# Patient Record
Sex: Female | Born: 1945 | Race: White | Hispanic: No | Smoking: Never smoker
Health system: Southern US, Community
[De-identification: ages and names within clinical notes are randomized; demographics above are authoritative.]

## PROBLEM LIST (undated history)

## (undated) DIAGNOSIS — F32A Depression, unspecified: Secondary | ICD-10-CM

## (undated) DIAGNOSIS — M1990 Unspecified osteoarthritis, unspecified site: Secondary | ICD-10-CM

## (undated) DIAGNOSIS — E785 Hyperlipidemia, unspecified: Secondary | ICD-10-CM

## (undated) DIAGNOSIS — K635 Polyp of colon: Secondary | ICD-10-CM

## (undated) DIAGNOSIS — I428 Other cardiomyopathies: Secondary | ICD-10-CM

## (undated) DIAGNOSIS — I10 Essential (primary) hypertension: Secondary | ICD-10-CM

## (undated) DIAGNOSIS — N200 Calculus of kidney: Secondary | ICD-10-CM

## (undated) DIAGNOSIS — F329 Major depressive disorder, single episode, unspecified: Secondary | ICD-10-CM

## (undated) DIAGNOSIS — I509 Heart failure, unspecified: Secondary | ICD-10-CM

## (undated) DIAGNOSIS — E039 Hypothyroidism, unspecified: Secondary | ICD-10-CM

## (undated) DIAGNOSIS — D649 Anemia, unspecified: Secondary | ICD-10-CM

## (undated) DIAGNOSIS — I340 Nonrheumatic mitral (valve) insufficiency: Secondary | ICD-10-CM

## (undated) DIAGNOSIS — M25569 Pain in unspecified knee: Secondary | ICD-10-CM

## (undated) DIAGNOSIS — K219 Gastro-esophageal reflux disease without esophagitis: Secondary | ICD-10-CM

## (undated) DIAGNOSIS — F0390 Unspecified dementia without behavioral disturbance: Secondary | ICD-10-CM

## (undated) HISTORY — PX: CHOLECYSTECTOMY: SHX55

## (undated) HISTORY — DX: Calculus of kidney: N20.0

## (undated) HISTORY — DX: Anemia, unspecified: D64.9

## (undated) HISTORY — DX: Polyp of colon: K63.5

## (undated) HISTORY — DX: Depression, unspecified: F32.A

## (undated) HISTORY — DX: Essential (primary) hypertension: I10

## (undated) HISTORY — DX: Hypothyroidism, unspecified: E03.9

## (undated) HISTORY — DX: Unspecified dementia, unspecified severity, without behavioral disturbance, psychotic disturbance, mood disturbance, and anxiety: F03.90

## (undated) HISTORY — DX: Heart failure, unspecified: I50.9

## (undated) HISTORY — DX: Nonrheumatic mitral (valve) insufficiency: I34.0

## (undated) HISTORY — PX: TONSILLECTOMY: SUR1361

## (undated) HISTORY — DX: Pain in unspecified knee: M25.569

## (undated) HISTORY — PX: TOTAL KNEE ARTHROPLASTY: SHX125

## (undated) HISTORY — DX: Hyperlipidemia, unspecified: E78.5

## (undated) HISTORY — DX: Unspecified osteoarthritis, unspecified site: M19.90

## (undated) HISTORY — DX: Gastro-esophageal reflux disease without esophagitis: K21.9

## (undated) HISTORY — DX: Other cardiomyopathies: I42.8

## (undated) HISTORY — DX: Major depressive disorder, single episode, unspecified: F32.9

---

## 2002-07-07 ENCOUNTER — Other Ambulatory Visit: Admission: RE | Admit: 2002-07-07 | Discharge: 2002-07-07 | Payer: Self-pay | Admitting: Family Medicine

## 2003-09-01 ENCOUNTER — Other Ambulatory Visit: Admission: RE | Admit: 2003-09-01 | Discharge: 2003-09-01 | Payer: Self-pay | Admitting: Family Medicine

## 2004-05-07 ENCOUNTER — Encounter: Admission: RE | Admit: 2004-05-07 | Discharge: 2004-05-07 | Payer: Self-pay | Admitting: Family Medicine

## 2004-10-11 ENCOUNTER — Encounter: Admission: RE | Admit: 2004-10-11 | Discharge: 2004-10-11 | Payer: Self-pay | Admitting: Family Medicine

## 2004-10-26 ENCOUNTER — Ambulatory Visit (HOSPITAL_COMMUNITY): Admission: RE | Admit: 2004-10-26 | Discharge: 2004-10-26 | Payer: Self-pay | Admitting: Gastroenterology

## 2006-04-09 ENCOUNTER — Ambulatory Visit: Payer: Self-pay | Admitting: Internal Medicine

## 2006-05-20 ENCOUNTER — Ambulatory Visit: Payer: Self-pay | Admitting: Internal Medicine

## 2006-06-02 ENCOUNTER — Ambulatory Visit: Payer: Self-pay | Admitting: Internal Medicine

## 2006-07-21 ENCOUNTER — Encounter: Admission: RE | Admit: 2006-07-21 | Discharge: 2006-07-21 | Payer: Self-pay | Admitting: Otolaryngology

## 2006-08-07 ENCOUNTER — Ambulatory Visit: Payer: Self-pay | Admitting: Internal Medicine

## 2006-08-21 ENCOUNTER — Ambulatory Visit: Payer: Self-pay | Admitting: Internal Medicine

## 2006-08-22 ENCOUNTER — Ambulatory Visit (HOSPITAL_COMMUNITY): Admission: RE | Admit: 2006-08-22 | Discharge: 2006-08-22 | Payer: Self-pay | Admitting: Internal Medicine

## 2006-09-01 ENCOUNTER — Ambulatory Visit: Payer: Self-pay | Admitting: Internal Medicine

## 2006-09-23 ENCOUNTER — Ambulatory Visit: Payer: Self-pay | Admitting: Internal Medicine

## 2007-03-12 ENCOUNTER — Ambulatory Visit: Payer: Self-pay | Admitting: Internal Medicine

## 2007-03-12 LAB — CONVERTED CEMR LAB
BUN: 13 mg/dL (ref 6–23)
Basophils Absolute: 0 10*3/uL (ref 0.0–0.1)
Basophils Relative: 0.1 % (ref 0.0–1.0)
CO2: 29 meq/L (ref 19–32)
Calcium: 9.1 mg/dL (ref 8.4–10.5)
Chloride: 109 meq/L (ref 96–112)
Creatinine, Ser: 0.8 mg/dL (ref 0.4–1.2)
Eosinophils Absolute: 0.2 10*3/uL (ref 0.0–0.6)
Eosinophils Relative: 2.5 % (ref 0.0–5.0)
GFR calc Af Amer: 93 mL/min
GFR calc non Af Amer: 77 mL/min
Glucose, Bld: 112 mg/dL — ABNORMAL HIGH (ref 70–99)
HCT: 27.3 % — ABNORMAL LOW (ref 36.0–46.0)
Hemoglobin: 8.8 g/dL — ABNORMAL LOW (ref 12.0–15.0)
Lymphocytes Relative: 13.1 % (ref 12.0–46.0)
MCHC: 32.3 g/dL (ref 30.0–36.0)
MCV: 79.1 fL (ref 78.0–100.0)
Monocytes Absolute: 0.7 10*3/uL (ref 0.2–0.7)
Monocytes Relative: 9.5 % (ref 3.0–11.0)
Neutro Abs: 4.9 10*3/uL (ref 1.4–7.7)
Neutrophils Relative %: 74.8 % (ref 43.0–77.0)
Platelets: 241 10*3/uL (ref 150–400)
Potassium: 3.7 meq/L (ref 3.5–5.1)
Pro B Natriuretic peptide (BNP): 431 pg/mL — ABNORMAL HIGH (ref 0.0–100.0)
RBC: 3.46 M/uL — ABNORMAL LOW (ref 3.87–5.11)
RDW: 15.1 % — ABNORMAL HIGH (ref 11.5–14.6)
Sodium: 145 meq/L (ref 135–145)
TSH: 2.83 microintl units/mL (ref 0.35–5.50)
WBC: 7 10*3/uL (ref 4.5–10.5)

## 2007-03-20 ENCOUNTER — Ambulatory Visit: Payer: Self-pay | Admitting: Internal Medicine

## 2007-03-20 LAB — CONVERTED CEMR LAB
Basophils Absolute: 0 10*3/uL (ref 0.0–0.1)
Basophils Relative: 0.5 % (ref 0.0–1.0)
Eosinophils Absolute: 0.1 10*3/uL (ref 0.0–0.6)
Eosinophils Relative: 0.9 % (ref 0.0–5.0)
HCT: 27.7 % — ABNORMAL LOW (ref 36.0–46.0)
Hemoglobin: 8.8 g/dL — ABNORMAL LOW (ref 12.0–15.0)
Iron: 13 ug/dL — ABNORMAL LOW (ref 42–145)
Lymphocytes Relative: 12.8 % (ref 12.0–46.0)
MCHC: 31.7 g/dL (ref 30.0–36.0)
MCV: 76.9 fL — ABNORMAL LOW (ref 78.0–100.0)
Monocytes Absolute: 0.7 10*3/uL (ref 0.2–0.7)
Monocytes Relative: 7.5 % (ref 3.0–11.0)
Neutro Abs: 7.7 10*3/uL (ref 1.4–7.7)
Neutrophils Relative %: 78.3 % — ABNORMAL HIGH (ref 43.0–77.0)
Platelets: 264 10*3/uL (ref 150–400)
Pro B Natriuretic peptide (BNP): 856 pg/mL — ABNORMAL HIGH (ref 0.0–100.0)
RBC: 3.6 M/uL — ABNORMAL LOW (ref 3.87–5.11)
RDW: 15.6 % — ABNORMAL HIGH (ref 11.5–14.6)
Saturation Ratios: 2.3 % — ABNORMAL LOW (ref 20.0–50.0)
Transferrin: 398.9 mg/dL — ABNORMAL HIGH (ref >=212.0)
WBC: 9.8 10*3/uL (ref 4.5–10.5)

## 2007-03-27 ENCOUNTER — Ambulatory Visit: Payer: Self-pay | Admitting: Pulmonary Disease

## 2007-03-27 LAB — CONVERTED CEMR LAB
Basophils Absolute: 0 10*3/uL (ref 0.0–0.1)
Basophils Relative: 0 % (ref 0.0–1.0)
Eosinophils Absolute: 0.3 10*3/uL (ref 0.0–0.6)
Eosinophils Relative: 4.4 % (ref 0.0–5.0)
Folate: 11.4 ng/mL
HCT: 30.5 % — ABNORMAL LOW (ref 36.0–46.0)
Hemoglobin: 9.5 g/dL — ABNORMAL LOW (ref 12.0–15.0)
Iron: 15 ug/dL — ABNORMAL LOW (ref 42–145)
Lymphocytes Relative: 16.4 % (ref 12.0–46.0)
MCHC: 31.1 g/dL (ref 30.0–36.0)
MCV: 75.9 fL — ABNORMAL LOW (ref 78.0–100.0)
Monocytes Absolute: 0.9 10*3/uL — ABNORMAL HIGH (ref 0.2–0.7)
Monocytes Relative: 14.2 % — ABNORMAL HIGH (ref 3.0–11.0)
Neutro Abs: 4.2 10*3/uL (ref 1.4–7.7)
Neutrophils Relative %: 65 % (ref 43.0–77.0)
Platelets: 276 10*3/uL (ref 150–400)
Pro B Natriuretic peptide (BNP): 223 pg/mL — ABNORMAL HIGH (ref 0.0–100.0)
RBC: 4.01 M/uL (ref 3.87–5.11)
RDW: 16.9 % — ABNORMAL HIGH (ref 11.5–14.6)
Saturation Ratios: 2.4 % — ABNORMAL LOW (ref 20.0–50.0)
Transferrin: 444.4 mg/dL — ABNORMAL HIGH (ref >=212.0)
Vitamin B-12: 1477 pg/mL — ABNORMAL HIGH (ref 211–911)
WBC: 6.4 10*3/uL (ref 4.5–10.5)

## 2007-04-02 ENCOUNTER — Ambulatory Visit (HOSPITAL_COMMUNITY): Admission: RE | Admit: 2007-04-02 | Discharge: 2007-04-02 | Payer: Self-pay | Admitting: Gastroenterology

## 2007-04-25 HISTORY — PX: CARDIAC CATHETERIZATION: SHX172

## 2007-05-01 ENCOUNTER — Ambulatory Visit: Payer: Self-pay | Admitting: Internal Medicine

## 2007-05-01 ENCOUNTER — Inpatient Hospital Stay (HOSPITAL_COMMUNITY): Admission: EM | Admit: 2007-05-01 | Discharge: 2007-05-08 | Payer: Self-pay | Admitting: Internal Medicine

## 2007-05-02 ENCOUNTER — Encounter: Payer: Self-pay | Admitting: Internal Medicine

## 2007-05-03 ENCOUNTER — Ambulatory Visit: Payer: Self-pay | Admitting: Oncology

## 2007-05-03 ENCOUNTER — Ambulatory Visit: Payer: Self-pay | Admitting: Internal Medicine

## 2007-05-06 ENCOUNTER — Ambulatory Visit: Payer: Self-pay | Admitting: Oncology

## 2007-05-06 ENCOUNTER — Encounter: Payer: Self-pay | Admitting: Internal Medicine

## 2007-05-12 ENCOUNTER — Ambulatory Visit: Payer: Self-pay | Admitting: Family Medicine

## 2007-05-12 DIAGNOSIS — Z860100 Personal history of colon polyps, unspecified: Secondary | ICD-10-CM | POA: Insufficient documentation

## 2007-05-12 DIAGNOSIS — J309 Allergic rhinitis, unspecified: Secondary | ICD-10-CM | POA: Insufficient documentation

## 2007-05-12 DIAGNOSIS — I2510 Atherosclerotic heart disease of native coronary artery without angina pectoris: Secondary | ICD-10-CM | POA: Insufficient documentation

## 2007-05-12 DIAGNOSIS — F329 Major depressive disorder, single episode, unspecified: Secondary | ICD-10-CM

## 2007-05-12 DIAGNOSIS — I509 Heart failure, unspecified: Secondary | ICD-10-CM | POA: Insufficient documentation

## 2007-05-12 DIAGNOSIS — E039 Hypothyroidism, unspecified: Secondary | ICD-10-CM | POA: Insufficient documentation

## 2007-05-12 DIAGNOSIS — H409 Unspecified glaucoma: Secondary | ICD-10-CM | POA: Insufficient documentation

## 2007-05-12 DIAGNOSIS — J45909 Unspecified asthma, uncomplicated: Secondary | ICD-10-CM | POA: Insufficient documentation

## 2007-05-12 DIAGNOSIS — M1990 Unspecified osteoarthritis, unspecified site: Secondary | ICD-10-CM | POA: Insufficient documentation

## 2007-05-12 DIAGNOSIS — D509 Iron deficiency anemia, unspecified: Secondary | ICD-10-CM

## 2007-05-12 DIAGNOSIS — I10 Essential (primary) hypertension: Secondary | ICD-10-CM | POA: Insufficient documentation

## 2007-05-12 DIAGNOSIS — R413 Other amnesia: Secondary | ICD-10-CM | POA: Insufficient documentation

## 2007-05-12 DIAGNOSIS — K219 Gastro-esophageal reflux disease without esophagitis: Secondary | ICD-10-CM

## 2007-05-12 DIAGNOSIS — E785 Hyperlipidemia, unspecified: Secondary | ICD-10-CM

## 2007-05-12 DIAGNOSIS — Z87442 Personal history of urinary calculi: Secondary | ICD-10-CM

## 2007-05-15 LAB — CBC WITH DIFFERENTIAL/PLATELET
BASO%: 0.7 % (ref 0.0–2.0)
EOS%: 3.9 % (ref 0.0–7.0)
MCH: 21.9 pg — ABNORMAL LOW (ref 26.0–34.0)
MCHC: 31.1 g/dL — ABNORMAL LOW (ref 32.0–36.0)
NEUT%: 68.4 % (ref 39.6–76.8)
RBC: 4.92 10*6/uL (ref 3.70–5.32)
RDW: 21.3 % — ABNORMAL HIGH (ref 11.3–14.5)
lymph#: 1 10*3/uL (ref 0.9–3.3)

## 2007-05-19 ENCOUNTER — Telehealth: Payer: Self-pay | Admitting: Internal Medicine

## 2007-05-25 ENCOUNTER — Ambulatory Visit: Payer: Self-pay | Admitting: Internal Medicine

## 2007-05-25 LAB — CONVERTED CEMR LAB
BUN: 24 mg/dL — ABNORMAL HIGH (ref 6–23)
CO2: 30 meq/L (ref 19–32)
Calcium: 10 mg/dL (ref 8.4–10.5)
Chloride: 104 meq/L (ref 96–112)
Creatinine, Ser: 1.4 mg/dL — ABNORMAL HIGH (ref 0.4–1.2)
GFR calc Af Amer: 49 mL/min
GFR calc non Af Amer: 40 mL/min
Glucose, Bld: 110 mg/dL — ABNORMAL HIGH (ref 70–99)
Potassium: 3.8 meq/L (ref 3.5–5.1)
Sodium: 141 meq/L (ref 135–145)

## 2007-05-28 LAB — CBC & DIFF AND RETIC
Basophils Absolute: 0 10*3/uL (ref 0.0–0.1)
EOS%: 2.6 % (ref 0.0–7.0)
HGB: 11.1 g/dL — ABNORMAL LOW (ref 11.6–15.9)
IRF: 0.15 (ref 0.130–0.330)
MCH: 22.8 pg — ABNORMAL LOW (ref 26.0–34.0)
NEUT#: 3.7 10*3/uL (ref 1.5–6.5)
RDW: 23.3 % — ABNORMAL HIGH (ref 11.3–14.5)
RETIC #: 70 10*3/uL (ref 19.7–115.1)
lymph#: 0.8 10*3/uL — ABNORMAL LOW (ref 0.9–3.3)

## 2007-05-29 ENCOUNTER — Telehealth: Payer: Self-pay | Admitting: Family Medicine

## 2007-05-29 LAB — COMPREHENSIVE METABOLIC PANEL
ALT: 27 U/L (ref 0–35)
AST: 38 U/L — ABNORMAL HIGH (ref 0–37)
Alkaline Phosphatase: 75 U/L (ref 39–117)
Calcium: 9.7 mg/dL (ref 8.4–10.5)
Chloride: 102 mEq/L (ref 96–112)
Creatinine, Ser: 1.12 mg/dL (ref 0.40–1.20)
Total Bilirubin: 0.8 mg/dL (ref 0.3–1.2)

## 2007-05-29 LAB — HAPTOGLOBIN: Haptoglobin: 106 mg/dL (ref 16–200)

## 2007-06-02 ENCOUNTER — Telehealth: Payer: Self-pay | Admitting: Family Medicine

## 2007-06-03 ENCOUNTER — Ambulatory Visit: Payer: Self-pay | Admitting: Internal Medicine

## 2007-06-03 ENCOUNTER — Ambulatory Visit: Payer: Self-pay

## 2007-06-03 ENCOUNTER — Encounter: Payer: Self-pay | Admitting: Internal Medicine

## 2007-06-03 LAB — CONVERTED CEMR LAB
BUN: 16 mg/dL (ref 6–23)
CO2: 30 meq/L (ref 19–32)
Calcium: 9.2 mg/dL (ref 8.4–10.5)
Chloride: 99 meq/L (ref 96–112)
Creatinine, Ser: 1.5 mg/dL — ABNORMAL HIGH (ref 0.4–1.2)
GFR calc Af Amer: 45 mL/min
GFR calc non Af Amer: 37 mL/min
Glucose, Bld: 99 mg/dL (ref 70–99)
Potassium: 3.9 meq/L (ref 3.5–5.1)
Pro B Natriuretic peptide (BNP): 139 pg/mL — ABNORMAL HIGH (ref 0.0–100.0)
Sodium: 138 meq/L (ref 135–145)

## 2007-06-05 ENCOUNTER — Ambulatory Visit: Payer: Self-pay | Admitting: Family Medicine

## 2007-06-05 DIAGNOSIS — R630 Anorexia: Secondary | ICD-10-CM | POA: Insufficient documentation

## 2007-06-10 ENCOUNTER — Encounter: Admission: RE | Admit: 2007-06-10 | Discharge: 2007-06-10 | Payer: Self-pay | Admitting: Family Medicine

## 2007-06-15 LAB — CBC WITH DIFFERENTIAL/PLATELET
BASO%: 0.1 % (ref 0.0–2.0)
EOS%: 3.5 % (ref 0.0–7.0)
MCH: 23.5 pg — ABNORMAL LOW (ref 26.0–34.0)
MCHC: 32.5 g/dL (ref 32.0–36.0)
RDW: 25.4 % — ABNORMAL HIGH (ref 11.3–14.5)
WBC: 5.4 10*3/uL (ref 3.9–10.0)
lymph#: 1 10*3/uL (ref 0.9–3.3)

## 2007-06-16 ENCOUNTER — Encounter: Payer: Self-pay | Admitting: Family Medicine

## 2007-06-16 ENCOUNTER — Encounter: Payer: Self-pay | Admitting: Internal Medicine

## 2007-07-02 ENCOUNTER — Ambulatory Visit: Payer: Self-pay | Admitting: Internal Medicine

## 2007-07-09 ENCOUNTER — Ambulatory Visit: Payer: Self-pay | Admitting: Oncology

## 2007-07-13 LAB — COMPREHENSIVE METABOLIC PANEL
ALT: 22 U/L (ref 0–35)
AST: 31 U/L (ref 0–37)
Calcium: 9.7 mg/dL (ref 8.4–10.5)
Chloride: 106 mEq/L (ref 96–112)
Creatinine, Ser: 0.81 mg/dL (ref 0.40–1.20)

## 2007-07-13 LAB — CBC & DIFF AND RETIC
Basophils Absolute: 0 10*3/uL (ref 0.0–0.1)
EOS%: 7.4 % — ABNORMAL HIGH (ref 0.0–7.0)
Eosinophils Absolute: 0.4 10*3/uL (ref 0.0–0.5)
HGB: 11.9 g/dL (ref 11.6–15.9)
IRF: 0.23 (ref 0.130–0.330)
MONO#: 0.5 10*3/uL (ref 0.1–0.9)
NEUT#: 4.1 10*3/uL (ref 1.5–6.5)
RDW: 26.3 % — ABNORMAL HIGH (ref 11.3–14.5)
RETIC #: 24.1 10*3/uL (ref 19.7–115.1)
WBC: 5.8 10*3/uL (ref 3.9–10.0)
lymph#: 0.7 10*3/uL — ABNORMAL LOW (ref 0.9–3.3)

## 2007-07-27 ENCOUNTER — Telehealth: Payer: Self-pay | Admitting: Family Medicine

## 2007-08-12 ENCOUNTER — Telehealth: Payer: Self-pay | Admitting: Family Medicine

## 2007-08-14 ENCOUNTER — Ambulatory Visit: Payer: Self-pay | Admitting: Family Medicine

## 2007-08-14 DIAGNOSIS — J209 Acute bronchitis, unspecified: Secondary | ICD-10-CM

## 2007-08-14 LAB — CONVERTED CEMR LAB: Hemoglobin: 11.5 g/dL

## 2007-08-19 ENCOUNTER — Telehealth (INDEPENDENT_AMBULATORY_CARE_PROVIDER_SITE_OTHER): Payer: Self-pay | Admitting: *Deleted

## 2007-08-25 ENCOUNTER — Ambulatory Visit: Payer: Self-pay | Admitting: Oncology

## 2007-08-27 LAB — CBC WITH DIFFERENTIAL/PLATELET
BASO%: 0.4 % (ref 0.0–2.0)
EOS%: 10.1 % — ABNORMAL HIGH (ref 0.0–7.0)
HCT: 34.6 % — ABNORMAL LOW (ref 34.8–46.6)
LYMPH%: 11.6 % — ABNORMAL LOW (ref 14.0–48.0)
MCH: 27.5 pg (ref 26.0–34.0)
MCHC: 33.8 g/dL (ref 32.0–36.0)
MONO%: 7.7 % (ref 0.0–13.0)
NEUT%: 70.2 % (ref 39.6–76.8)
Platelets: 155 10*3/uL (ref 145–400)

## 2007-09-08 ENCOUNTER — Encounter: Admission: RE | Admit: 2007-09-08 | Discharge: 2007-10-20 | Payer: Self-pay | Admitting: Neurology

## 2007-09-18 ENCOUNTER — Ambulatory Visit: Payer: Self-pay | Admitting: Family Medicine

## 2007-09-18 DIAGNOSIS — L659 Nonscarring hair loss, unspecified: Secondary | ICD-10-CM | POA: Insufficient documentation

## 2007-10-01 ENCOUNTER — Telehealth (INDEPENDENT_AMBULATORY_CARE_PROVIDER_SITE_OTHER): Payer: Self-pay | Admitting: *Deleted

## 2007-10-01 ENCOUNTER — Ambulatory Visit: Payer: Self-pay | Admitting: Internal Medicine

## 2007-10-07 ENCOUNTER — Emergency Department (HOSPITAL_COMMUNITY): Admission: EM | Admit: 2007-10-07 | Discharge: 2007-10-07 | Payer: Self-pay | Admitting: Emergency Medicine

## 2007-10-07 ENCOUNTER — Ambulatory Visit: Payer: Self-pay | Admitting: Oncology

## 2007-10-09 ENCOUNTER — Ambulatory Visit: Payer: Self-pay | Admitting: Family Medicine

## 2007-10-12 LAB — CBC WITH DIFFERENTIAL/PLATELET
BASO%: 0.8 % (ref 0.0–2.0)
Eosinophils Absolute: 0.9 10*3/uL — ABNORMAL HIGH (ref 0.0–0.5)
HCT: 35.9 % (ref 34.8–46.6)
LYMPH%: 18.6 % (ref 14.0–48.0)
MCHC: 34 g/dL (ref 32.0–36.0)
MCV: 83.7 fL (ref 81.0–101.0)
MONO#: 0.6 10*3/uL (ref 0.1–0.9)
MONO%: 9.3 % (ref 0.0–13.0)
NEUT%: 57.6 % (ref 39.6–76.8)
Platelets: 160 10*3/uL (ref 145–400)
WBC: 6.8 10*3/uL (ref 3.9–10.0)

## 2007-10-15 ENCOUNTER — Telehealth: Payer: Self-pay | Admitting: Family Medicine

## 2007-10-16 ENCOUNTER — Ambulatory Visit: Payer: Self-pay | Admitting: Emergency Medicine

## 2007-10-23 ENCOUNTER — Telehealth: Payer: Self-pay | Admitting: Family Medicine

## 2007-10-30 ENCOUNTER — Telehealth: Payer: Self-pay | Admitting: Family Medicine

## 2007-11-03 ENCOUNTER — Ambulatory Visit: Payer: Self-pay | Admitting: Family Medicine

## 2007-11-13 ENCOUNTER — Ambulatory Visit: Payer: Self-pay | Admitting: Emergency Medicine

## 2007-11-18 ENCOUNTER — Telehealth: Payer: Self-pay | Admitting: Family Medicine

## 2007-11-24 ENCOUNTER — Ambulatory Visit: Payer: Self-pay

## 2007-11-24 ENCOUNTER — Encounter: Payer: Self-pay | Admitting: Internal Medicine

## 2007-11-30 ENCOUNTER — Ambulatory Visit: Payer: Self-pay | Admitting: Licensed Clinical Social Worker

## 2007-11-30 ENCOUNTER — Ambulatory Visit: Payer: Self-pay | Admitting: Oncology

## 2007-11-30 LAB — CBC WITH DIFFERENTIAL/PLATELET
Basophils Absolute: 0.1 10*3/uL (ref 0.0–0.1)
EOS%: 13.5 % — ABNORMAL HIGH (ref 0.0–7.0)
Eosinophils Absolute: 0.8 10*3/uL — ABNORMAL HIGH (ref 0.0–0.5)
HCT: 38 % (ref 34.8–46.6)
HGB: 12.7 g/dL (ref 11.6–15.9)
LYMPH%: 17.6 % (ref 14.0–48.0)
MCH: 28.4 pg (ref 26.0–34.0)
MCV: 85.1 fL (ref 81.0–101.0)
MONO%: 7.2 % (ref 0.0–13.0)
NEUT#: 3.5 10*3/uL (ref 1.5–6.5)
NEUT%: 60.8 % (ref 39.6–76.8)
Platelets: 192 10*3/uL (ref 145–400)
RDW: 15.1 % — ABNORMAL HIGH (ref 11.3–14.5)

## 2007-12-14 ENCOUNTER — Ambulatory Visit: Payer: Self-pay | Admitting: Internal Medicine

## 2007-12-17 ENCOUNTER — Ambulatory Visit: Payer: Self-pay | Admitting: Thoracic Surgery (Cardiothoracic Vascular Surgery)

## 2007-12-24 ENCOUNTER — Telehealth: Payer: Self-pay | Admitting: Family Medicine

## 2007-12-28 ENCOUNTER — Ambulatory Visit: Payer: Self-pay | Admitting: Dentistry

## 2007-12-28 ENCOUNTER — Encounter: Admission: RE | Admit: 2007-12-28 | Discharge: 2007-12-28 | Payer: Self-pay | Admitting: Dentistry

## 2008-01-01 ENCOUNTER — Telehealth: Payer: Self-pay | Admitting: Family Medicine

## 2008-01-04 ENCOUNTER — Ambulatory Visit: Payer: Self-pay | Admitting: Dentistry

## 2008-01-06 ENCOUNTER — Ambulatory Visit: Payer: Self-pay | Admitting: Oncology

## 2008-01-11 LAB — CBC WITH DIFFERENTIAL/PLATELET
Basophils Absolute: 0 10*3/uL (ref 0.0–0.1)
EOS%: 7.7 % — ABNORMAL HIGH (ref 0.0–7.0)
Eosinophils Absolute: 0.4 10*3/uL (ref 0.0–0.5)
HCT: 37 % (ref 34.8–46.6)
HGB: 12.7 g/dL (ref 11.6–15.9)
MCH: 28.8 pg (ref 26.0–34.0)
MONO#: 0.4 10*3/uL (ref 0.1–0.9)
NEUT#: 3.5 10*3/uL (ref 1.5–6.5)
NEUT%: 67.9 % (ref 39.6–76.8)
RDW: 13.3 % (ref 11.3–14.5)
lymph#: 0.8 10*3/uL — ABNORMAL LOW (ref 0.9–3.3)

## 2008-01-14 ENCOUNTER — Ambulatory Visit: Payer: Self-pay | Admitting: Internal Medicine

## 2008-02-15 ENCOUNTER — Ambulatory Visit: Payer: Self-pay | Admitting: Thoracic Surgery (Cardiothoracic Vascular Surgery)

## 2008-02-23 ENCOUNTER — Ambulatory Visit: Payer: Self-pay | Admitting: Internal Medicine

## 2008-02-24 ENCOUNTER — Telehealth: Payer: Self-pay | Admitting: Family Medicine

## 2008-03-08 ENCOUNTER — Ambulatory Visit: Payer: Self-pay | Admitting: Dentistry

## 2008-04-08 ENCOUNTER — Ambulatory Visit: Payer: Self-pay | Admitting: Oncology

## 2008-04-12 LAB — CBC WITH DIFFERENTIAL/PLATELET
BASO%: 0.5 % (ref 0.0–2.0)
EOS%: 7.8 % — ABNORMAL HIGH (ref 0.0–7.0)
Eosinophils Absolute: 0.4 10*3/uL (ref 0.0–0.5)
MCH: 30.2 pg (ref 26.0–34.0)
MCV: 88.3 fL (ref 81.0–101.0)
NEUT#: 3 10*3/uL (ref 1.5–6.5)
Platelets: 168 10*3/uL (ref 145–400)
RBC: 4.14 10*6/uL (ref 3.70–5.32)

## 2008-04-15 ENCOUNTER — Ambulatory Visit: Payer: Self-pay | Admitting: Family Medicine

## 2008-04-15 DIAGNOSIS — M25569 Pain in unspecified knee: Secondary | ICD-10-CM

## 2008-04-21 ENCOUNTER — Ambulatory Visit: Payer: Self-pay | Admitting: Internal Medicine

## 2008-05-10 ENCOUNTER — Ambulatory Visit: Payer: Self-pay | Admitting: Family Medicine

## 2008-06-01 ENCOUNTER — Encounter: Admission: RE | Admit: 2008-06-01 | Discharge: 2008-06-01 | Payer: Self-pay | Admitting: Family Medicine

## 2008-06-08 ENCOUNTER — Telehealth: Payer: Self-pay | Admitting: Family Medicine

## 2008-06-30 ENCOUNTER — Ambulatory Visit: Payer: Self-pay | Admitting: Internal Medicine

## 2008-07-07 ENCOUNTER — Encounter: Payer: Self-pay | Admitting: Internal Medicine

## 2008-07-07 ENCOUNTER — Ambulatory Visit: Payer: Self-pay

## 2008-07-15 ENCOUNTER — Encounter: Payer: Self-pay | Admitting: Family Medicine

## 2008-07-18 ENCOUNTER — Encounter: Payer: Self-pay | Admitting: Family Medicine

## 2008-08-12 ENCOUNTER — Ambulatory Visit: Payer: Self-pay | Admitting: Oncology

## 2008-08-16 ENCOUNTER — Encounter: Payer: Self-pay | Admitting: Family Medicine

## 2008-08-16 LAB — CBC WITH DIFFERENTIAL/PLATELET
Basophils Absolute: 0 10*3/uL (ref 0.0–0.1)
Eosinophils Absolute: 0.5 10*3/uL (ref 0.0–0.5)
HCT: 36.9 % (ref 34.8–46.6)
HGB: 12.5 g/dL (ref 11.6–15.9)
LYMPH%: 22.7 % (ref 14.0–49.7)
MCV: 89.7 fL (ref 79.5–101.0)
MONO%: 10.7 % (ref 0.0–14.0)
NEUT#: 2.8 10*3/uL (ref 1.5–6.5)
NEUT%: 55.7 % (ref 38.4–76.8)
Platelets: 158 10*3/uL (ref 145–400)
RDW: 14.3 % (ref 11.2–14.5)

## 2008-08-24 IMAGING — CR DG CHEST 1V PORT
1 series · 1 of 1 positions shown · non-contrast
Comparison: 05/01/07.

CLINICAL DATA: Congestive heart failure. 
 PORTABLE CHEST ? 1 VIEW:

[view not recorded]
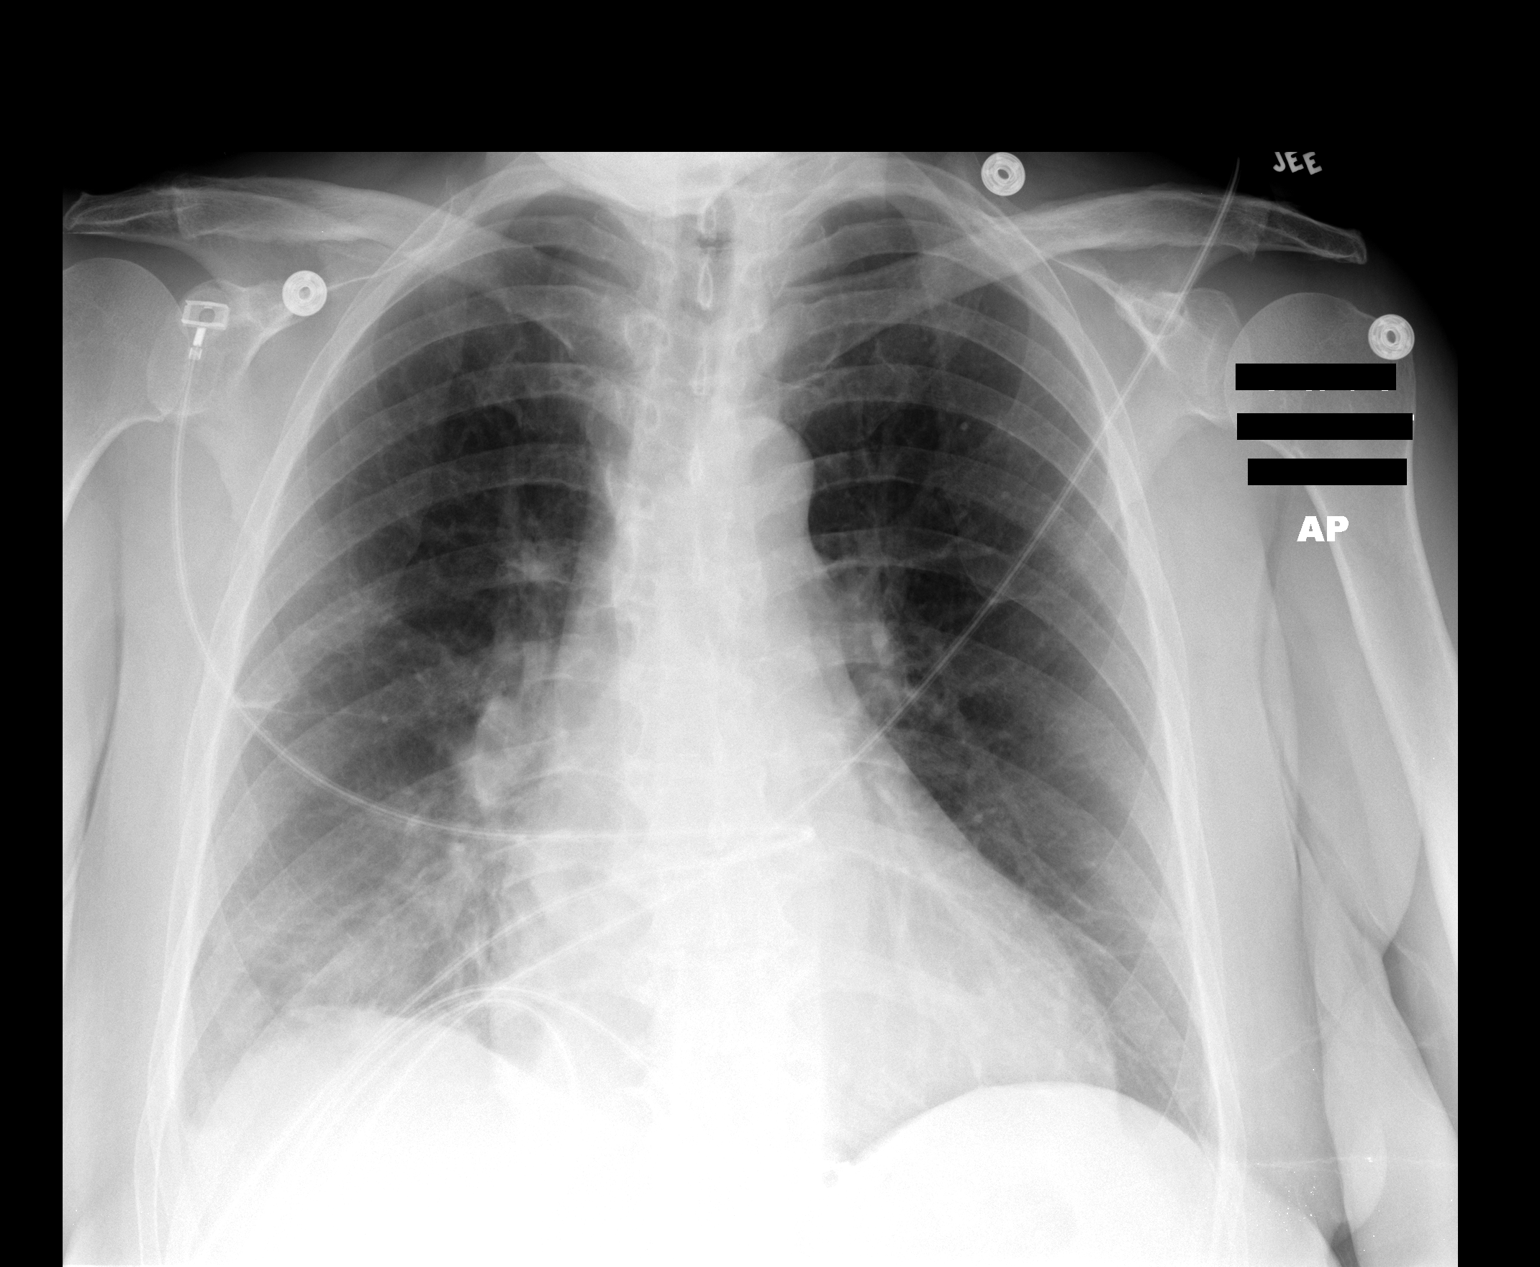

[1 of 1 positions shown; findings below may reference images not displayed]

FINDINGS: Cardiac enlargement is stable.  There is a small right pleural effusion, similar to prior exam. 
 There is new opacity at the right base, which may be due to atelectasis and/or infection.  The remaining portions of the lung are clear.
IMPRESSION: 1.  Stable cardiac enlargement and right pleural effusion. 
 2.  Decreased aeration to right base, which may be due to infection or atelectasis.

## 2008-09-09 ENCOUNTER — Telehealth (INDEPENDENT_AMBULATORY_CARE_PROVIDER_SITE_OTHER): Payer: Self-pay | Admitting: *Deleted

## 2008-09-22 DIAGNOSIS — I080 Rheumatic disorders of both mitral and aortic valves: Secondary | ICD-10-CM | POA: Insufficient documentation

## 2008-09-26 ENCOUNTER — Ambulatory Visit: Payer: Self-pay | Admitting: Emergency Medicine

## 2008-09-27 ENCOUNTER — Encounter: Payer: Self-pay | Admitting: Internal Medicine

## 2008-09-27 ENCOUNTER — Ambulatory Visit: Payer: Self-pay | Admitting: Internal Medicine

## 2008-10-03 ENCOUNTER — Telehealth: Payer: Self-pay | Admitting: Family Medicine

## 2008-10-26 ENCOUNTER — Ambulatory Visit: Payer: Self-pay | Admitting: Emergency Medicine

## 2008-10-27 ENCOUNTER — Telehealth: Payer: Self-pay | Admitting: Emergency Medicine

## 2008-10-28 ENCOUNTER — Encounter: Payer: Self-pay | Admitting: Family Medicine

## 2008-11-08 ENCOUNTER — Telehealth: Payer: Self-pay | Admitting: Family Medicine

## 2008-11-09 ENCOUNTER — Encounter: Payer: Self-pay | Admitting: Family Medicine

## 2008-12-15 ENCOUNTER — Ambulatory Visit: Payer: Self-pay | Admitting: Oncology

## 2008-12-16 ENCOUNTER — Encounter: Payer: Self-pay | Admitting: Internal Medicine

## 2008-12-19 ENCOUNTER — Telehealth: Payer: Self-pay | Admitting: Internal Medicine

## 2008-12-19 LAB — CBC WITH DIFFERENTIAL/PLATELET
BASO%: 0.2 % (ref 0.0–2.0)
LYMPH%: 12.4 % — ABNORMAL LOW (ref 14.0–49.7)
MCHC: 33.8 g/dL (ref 31.5–36.0)
MONO#: 0.5 10*3/uL (ref 0.1–0.9)
RBC: 3.45 10*6/uL — ABNORMAL LOW (ref 3.70–5.45)
WBC: 6.3 10*3/uL (ref 3.9–10.3)
lymph#: 0.8 10*3/uL — ABNORMAL LOW (ref 0.9–3.3)

## 2009-01-16 ENCOUNTER — Ambulatory Visit: Payer: Self-pay | Admitting: Oncology

## 2009-01-18 LAB — FERRITIN: Ferritin: 108 ng/mL (ref 10–291)

## 2009-01-18 LAB — CBC WITH DIFFERENTIAL/PLATELET
EOS%: 8.6 % — ABNORMAL HIGH (ref 0.0–7.0)
MCH: 31.1 pg (ref 25.1–34.0)
MCV: 90.8 fL (ref 79.5–101.0)
MONO%: 11.9 % (ref 0.0–14.0)
RBC: 3.69 10*6/uL — ABNORMAL LOW (ref 3.70–5.45)
RDW: 13.8 % (ref 11.2–14.5)

## 2009-01-30 ENCOUNTER — Encounter: Payer: Self-pay | Admitting: Internal Medicine

## 2009-01-30 ENCOUNTER — Ambulatory Visit: Payer: Self-pay | Admitting: Internal Medicine

## 2009-01-30 DIAGNOSIS — R6889 Other general symptoms and signs: Secondary | ICD-10-CM

## 2009-03-03 ENCOUNTER — Ambulatory Visit: Payer: Self-pay | Admitting: Family Medicine

## 2009-03-03 DIAGNOSIS — K2980 Duodenitis without bleeding: Secondary | ICD-10-CM | POA: Insufficient documentation

## 2009-03-03 DIAGNOSIS — F068 Other specified mental disorders due to known physiological condition: Secondary | ICD-10-CM | POA: Insufficient documentation

## 2009-03-03 LAB — CONVERTED CEMR LAB
Bilirubin Urine: NEGATIVE
Blood in Urine, dipstick: NEGATIVE
Glucose, Urine, Semiquant: NEGATIVE
Nitrite: NEGATIVE
Protein, U semiquant: NEGATIVE
Specific Gravity, Urine: <1.005
Urobilinogen, UA: 0.2
pH: 6.5

## 2009-03-10 ENCOUNTER — Ambulatory Visit: Payer: Self-pay | Admitting: Family Medicine

## 2009-03-10 DIAGNOSIS — G4700 Insomnia, unspecified: Secondary | ICD-10-CM

## 2009-03-23 ENCOUNTER — Telehealth (INDEPENDENT_AMBULATORY_CARE_PROVIDER_SITE_OTHER): Payer: Self-pay | Admitting: *Deleted

## 2009-04-11 ENCOUNTER — Ambulatory Visit: Payer: Self-pay | Admitting: Oncology

## 2009-04-13 ENCOUNTER — Encounter (INDEPENDENT_AMBULATORY_CARE_PROVIDER_SITE_OTHER): Payer: Self-pay | Admitting: *Deleted

## 2009-04-13 LAB — CBC WITH DIFFERENTIAL/PLATELET
Basophils Absolute: 0 10*3/uL (ref 0.0–0.1)
EOS%: 8.8 % — ABNORMAL HIGH (ref 0.0–7.0)
HGB: 11.8 g/dL (ref 11.6–15.9)
MCH: 30.6 pg (ref 25.1–34.0)
MCV: 89.9 fL (ref 79.5–101.0)
MONO%: 9.9 % (ref 0.0–14.0)
NEUT%: 59.4 % (ref 38.4–76.8)
RDW: 13.8 % (ref 11.2–14.5)

## 2009-05-02 ENCOUNTER — Telehealth (INDEPENDENT_AMBULATORY_CARE_PROVIDER_SITE_OTHER): Payer: Self-pay | Admitting: *Deleted

## 2009-05-04 ENCOUNTER — Encounter: Payer: Self-pay | Admitting: Internal Medicine

## 2009-05-25 ENCOUNTER — Encounter (INDEPENDENT_AMBULATORY_CARE_PROVIDER_SITE_OTHER): Payer: Self-pay | Admitting: *Deleted

## 2009-08-02 ENCOUNTER — Ambulatory Visit: Payer: Self-pay | Admitting: Internal Medicine

## 2009-08-07 ENCOUNTER — Inpatient Hospital Stay (HOSPITAL_COMMUNITY): Admission: EM | Admit: 2009-08-07 | Discharge: 2009-08-16 | Payer: Self-pay | Admitting: Emergency Medicine

## 2009-08-07 ENCOUNTER — Ambulatory Visit: Payer: Self-pay | Admitting: Cardiology

## 2009-08-08 ENCOUNTER — Encounter: Payer: Self-pay | Admitting: Family Medicine

## 2009-08-08 ENCOUNTER — Encounter (INDEPENDENT_AMBULATORY_CARE_PROVIDER_SITE_OTHER): Payer: Self-pay | Admitting: Internal Medicine

## 2009-08-24 ENCOUNTER — Ambulatory Visit: Payer: Self-pay | Admitting: Family Medicine

## 2009-09-04 ENCOUNTER — Ambulatory Visit: Payer: Self-pay | Admitting: Family Medicine

## 2009-09-04 DIAGNOSIS — G20 Parkinson's disease: Secondary | ICD-10-CM

## 2009-09-07 ENCOUNTER — Telehealth: Payer: Self-pay | Admitting: Family Medicine

## 2009-09-11 ENCOUNTER — Telehealth: Payer: Self-pay | Admitting: Family Medicine

## 2009-09-13 ENCOUNTER — Ambulatory Visit: Payer: Self-pay | Admitting: Emergency Medicine

## 2009-10-02 ENCOUNTER — Telehealth (INDEPENDENT_AMBULATORY_CARE_PROVIDER_SITE_OTHER): Payer: Self-pay | Admitting: *Deleted

## 2009-10-03 ENCOUNTER — Ambulatory Visit: Payer: Self-pay | Admitting: Family Medicine

## 2009-10-03 DIAGNOSIS — J019 Acute sinusitis, unspecified: Secondary | ICD-10-CM | POA: Insufficient documentation

## 2009-10-10 ENCOUNTER — Telehealth: Payer: Self-pay | Admitting: Family Medicine

## 2009-10-19 ENCOUNTER — Encounter: Payer: Self-pay | Admitting: Family Medicine

## 2009-12-08 ENCOUNTER — Ambulatory Visit: Payer: Self-pay | Admitting: Family Medicine

## 2010-01-01 ENCOUNTER — Ambulatory Visit: Payer: Self-pay | Admitting: Family Medicine

## 2010-01-01 DIAGNOSIS — M533 Sacrococcygeal disorders, not elsewhere classified: Secondary | ICD-10-CM | POA: Insufficient documentation

## 2010-01-01 DIAGNOSIS — B356 Tinea cruris: Secondary | ICD-10-CM | POA: Insufficient documentation

## 2010-03-07 ENCOUNTER — Ambulatory Visit: Payer: Self-pay | Admitting: Family Medicine

## 2010-03-19 ENCOUNTER — Ambulatory Visit: Payer: Self-pay | Admitting: Emergency Medicine

## 2010-03-27 ENCOUNTER — Ambulatory Visit: Payer: Self-pay | Admitting: Internal Medicine

## 2010-03-27 DIAGNOSIS — R0602 Shortness of breath: Secondary | ICD-10-CM | POA: Insufficient documentation

## 2010-04-20 ENCOUNTER — Encounter: Payer: Self-pay | Admitting: Family Medicine

## 2010-06-04 ENCOUNTER — Ambulatory Visit: Payer: Self-pay | Admitting: Emergency Medicine

## 2010-07-16 ENCOUNTER — Encounter: Payer: Self-pay | Admitting: Family Medicine

## 2010-07-17 ENCOUNTER — Telehealth: Payer: Self-pay | Admitting: Family Medicine

## 2010-07-19 ENCOUNTER — Ambulatory Visit
Admission: RE | Admit: 2010-07-19 | Discharge: 2010-07-19 | Payer: Self-pay | Source: Home / Self Care | Attending: Internal Medicine | Admitting: Internal Medicine

## 2010-07-22 LAB — CONVERTED CEMR LAB
Free T4: 0.7 ng/dL (ref 0.6–1.6)
TSH: 1.85 microintl units/mL (ref 0.35–5.50)

## 2010-07-24 NOTE — Assessment & Plan Note (Signed)
Summary: VCD, Parkinson's   Visit Type:  Follow-up Copy to:  [facility] Primary Provider/Referring Provider:  Nelwyn [place] MD  CC:  10 month follow up.  Having increased SOB "all the time" x2wks.  states she does wheezing "quite often." denies cough.  Requesting rxs for proair and symbicort.Marland Kitchen  History of Present Illness: Ms. Lorraine [name] is a very pleasant 65 year old with a history of gastroesophageal reflux disease, asthma, hyperlipidemia,attention deficit disorder and iron-deficiency anemia thought secondary to AVMs.  She was admitted to the hospital at the end of 08 with acute heart failure symptoms.  Echo showed an EF of about 30- 40%.    ROV 10/26/08 -- follows up today after 1 month. Last visit we restarted Symbicort, also restarted Nasacort (no loratadine). Has had significant improvement in her breathing, able to exert more easily and is walking with her friends and sister, wheezing has decreased significantly. Still has hoarseness and upper airway noise. Remains on protonix.   ROV 09/13/09 -- Returns for follow up. Has been unsteady on her feet, fell in 2/11 and was admitted to [facility]. Has been dx with Parkinson's with dementia. She is having more trouble with nasal gtt, UA irritation. Ran out of Symbicort 3 mo ago and has not restarted. Hears raspy voice but not dyspneic.   Current Medications (verified): 1)  Proair Hfa 108 (90 Base) Mcg/act Aers (Albuterol Sulfate) .... Inhale 2 Puff As Directed Every Four Hours 2)  Aspir-Low 81 Mg Tbec (Aspirin) .Marland Kitchen.. 1 Once Daily Pc 3)  Coreg 6.25 Mg  Tabs (Carvedilol) .Marland Kitchen.. 1 By Mouth Two Times A Day 4)  Calcium /d   Tabs (Calcium Carbonate-Vitamin D) .Marland Kitchen.. 1 By Mouth Two Times A Day 5)  Alprazolam 1 Mg  Tabs (Alprazolam) .... 1/2  By Mouth Four Times A Day As Needed For Anxiety 6)  Lamotrigine 200 Mg Tabs (Lamotrigine) .... Take One Tablet By Mouth Once Daily in The Am 7)  Symbicort 80-4.5 Mcg/act Aero (Budesonide-Formoterol Fumarate) .... 2 Puffs Two Times  A Day 8)  Omeprazole 40 Mg Cpdr (Omeprazole) .... Once Daily 9)  Seroquel Xr 150 Mg Xr24h-Tab (Quetiapine Fumarate) .Marland Kitchen.. 1 At Bedtime 10)  Diovan 40 Mg Tabs (Valsartan) .... Two Times A Day 11)  Carbidopa-Levodopa 10-100 Mg Tabs (Carbidopa-Levodopa) .Marland Kitchen.. 1 Three Times A Day  Allergies (verified): 1)  ! * Aricept 2)  ! * Tramadol  Vital Signs:  Patient profile:   65 year old female Height:      65 inches Weight:      150 pounds BMI:     25.05 O2 Sat:      99 % on Room air Temp:     97.2 degrees F oral Pulse rate:   76 / minute BP sitting:   140 / 80  (right arm) Cuff size:   regular  Vitals Entered By: Gweneth Dimitri RN (September 13, 2009 4:37 PM)  O2 Flow:  Room air CC: 10 month follow up.  Having increased SOB "all the time" x2wks.  states she does wheezing "quite often." denies cough.  Requesting rxs for proair and symbicort. Comments Medications reviewed with patient Daytime contact number verified with patient. Gweneth Dimitri RN  September 13, 2009 4:36 PM     Impression & Recommendations:  Problem # 1:  ALLERGIC RHINITIS (ICD-477.9)  add loratadine + veramyst  Orders: Est. Patient Level IV ([address])  Problem # 2:  ASTHMA (ICD-493.90) With VCD. Stop symbicort, use as needed SABA  Medications Added to Medication List  This Visit: 1)  Proventil Hfa 108 (90 Base) Mcg/act Aers (Albuterol sulfate) .... 2 puffs q4h as needed for sob 2)  Diovan 40 Mg Tabs (Valsartan) .... Two times a day 3)  Loratadine 10 Mg Tabs (Loratadine) .Marland Kitchen.. 1 by mouth once daily  Patient Instructions: 1)  Start taking loratadine 10mg  by mouth once daily 2)  Start Veramyst 2 sprays each nostril once daily. If this medication is beneficial then call our office and we will send a script to your pharmacy. 3)  Use Proventil 2 puffs up to every 4 hours if needed for shortness of breath.  4)  Follow up with [name] in 6 months or as needed.  Prescriptions: PROVENTIL HFA 108 (90 BASE) MCG/ACT AERS (ALBUTEROL  SULFATE) 2 puffs q4h as needed for SOB  #1 x 5   Entered and Authorized by:   Leslye Peer MD   Signed by:   Leslye Peer MD on 09/13/2009   Method used:   Electronically to        [facility]  [address] [id number]* (retail)       [address]       [place], [place]  [address]       Ph: [phone] or [phone]       Fax: [phone]   RxID:   [id number] LORATADINE 10 MG TABS (LORATADINE) 1 by mouth once daily  #30 x 11   Entered and Authorized by:   Leslye Peer MD   Signed by:   Leslye Peer MD on 09/13/2009   Method used:   Electronically to        [facility]  [address] [id number]* (retail)       [address]       [place], [place]  [address]       Ph: [phone] or [phone]       Fax: [phone]   RxID:   [id number]    Immunization History:  Influenza Immunization History:    Influenza:  historical (05/24/2009)

## 2010-07-24 NOTE — Assessment & Plan Note (Signed)
Summary: 3 month rov/njr   Vital Signs:  Patient profile:   65 year old female Weight:      145 pounds O2 Sat:      96 % Temp:     98 degrees F Pulse rate:   77 / minute BP sitting:   110 / 70  (left arm)  Vitals Entered By: Pura Spice, RN (March 07, 2010 1:14 PM) CC: 3 month folow up -- questions to ask   History of Present Illness: Here with her sister for routine follow up. She is doing well and has no concerns today. Dr. Nolen Mu doubled up on her dose of Nuedexta last month, and this has helped. Her sacral pain has gone away. She will see her Neurologist   Allergies: 1)  ! * Aricept 2)  ! * Tramadol  Review of Systems       Flu Vaccine Consent Questions     Do you have a history of severe allergic reactions to this vaccine? no    Any prior history of allergic reactions to egg and/or gelatin? no    Do you have a sensitivity to the preservative Thimersol? no    Do you have a past history of Guillan-Barre Syndrome? no    Do you currently have an acute febrile illness? no    Have you ever had a severe reaction to latex? no    Vaccine information given and explained to patient? yes    Are you currently pregnant? no    Lot Number:[id number]   Exp Date:12/22/2010   Site Given  Left Deltoid IM Pura Spice, RN  March 07, 2010 1:53 PM    Complete Medication List: 1)  Proventil Hfa 108 (90 Base) Mcg/act Aers (Albuterol sulfate) .... 2 puffs q4h as needed for sob 2)  Aspir-low 81 Mg Tbec (Aspirin) .Marland Kitchen.. 1 once daily pc 3)  Coreg 6.25 Mg Tabs (Carvedilol) .Marland Kitchen.. 1 by mouth two times a day 4)  Calcium /d Tabs (calcium Carbonate-vitamin D)  .Marland Kitchen.. 1 by mouth two times a day 5)  Alprazolam 1 Mg Tabs (Alprazolam) .... 1/2  by mouth four times a day as needed for anxiety 6)  Lamotrigine 200 Mg Tabs (Lamotrigine) .... Take one tablet by mouth once daily in the am 7)  Omeprazole 40 Mg Cpdr (Omeprazole) .... Once daily 8)  Seroquel Xr 150 Mg Xr24h-tab (Quetiapine fumarate)  .Marland Kitchen.. 1 at bedtime 9)  Carbidopa-levodopa 10-100 Mg Tabs (Carbidopa-levodopa) .Marland Kitchen.. 1 three times a day 10)  Loratadine 10 Mg Tabs (Loratadine) .Marland Kitchen.. 1 by mouth once daily 11)  Veramyst 27.5 Mcg/spray Susp (Fluticasone furoate) .... 2 sprays each nostril once daily 12)  Diovan 160 Mg Tabs (Valsartan) .Marland Kitchen.. 1 two times a day 13)  Hydrochlorothiazide 25 Mg Tabs (Hydrochlorothiazide) .Marland Kitchen.. 1 once daily 14)  Nuedexta 20-10 Mg Caps (Dextromethorphan-quinidine) .Marland Kitchen.. 1 once daily 15)  Ketoconazole 2 % Crea (Ketoconazole) .... Apply three times a day as needed for rash 16)  Aleve 220 Mg Tabs (Naproxen sodium) .... 2 tabs two times a day as needed pain  Other Orders: Flu Vaccine 54yrs + MEDICARE PATIENTS (G9211) Administration Flu vaccine - MCR (G0008)

## 2010-07-24 NOTE — Letter (Signed)
Summary: [facility]  [facility]   Imported By: Maryln Gottron 04/27/2010 14:16:44  _____________________________________________________________________  External Attachment:    Type:   Image     Comment:   External Document

## 2010-07-24 NOTE — Assessment & Plan Note (Signed)
Summary: CONGESTION/CB   Vital Signs:  Patient profile:   65 year old female Weight:      146 pounds Temp:     97.6 degrees F oral BP sitting:   82 / 64  (left arm) Cuff size:   regular  Vitals Entered By: Raechel Ache, RN (October 03, 2009 2:23 PM) CC: C/o head congestion, earaches, scratchy throat, cough   History of Present Illness: Here for 3 days of HA, sinus pressure, ST, and a dry cough. No fever.   Allergies: 1)  ! * Aricept 2)  ! * Tramadol  Past History:  Past Medical History: Reviewed history from 09/04/2009 and no changes required. 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 EF previously 25% Echo 1/10: EF 40%. moderate MR.     b. Coronaries ok by cath 11/08 2) Moderate to severe mitral regurgitation 3) depression, sees Dr. Emerson Monte 4) Asthma/allergies followed by Dr. Delton Coombes 5) h/o nephrolithiasis 6) HTN 7) Hyperlipidemia 8) GERD 9) knee pain 10) anemia dementia GLAUCOMA (ICD-365.9) OSTEOARTHRITIS (ICD-715.90) HYPOTHYROIDISM (ICD-244.9) COLONIC POLYPS, HX OF (ICD-V12.72) Parkinsons disease  Review of Systems  The patient denies anorexia, fever, weight loss, weight gain, vision loss, decreased hearing, hoarseness, chest pain, syncope, dyspnea on exertion, peripheral edema, hemoptysis, abdominal pain, melena, hematochezia, severe indigestion/heartburn, hematuria, incontinence, genital sores, muscle weakness, suspicious skin lesions, transient blindness, difficulty walking, depression, unusual weight change, abnormal bleeding, enlarged lymph nodes, angioedema, breast masses, and testicular masses.    Physical Exam  General:  Well-developed,well-nourished,in no acute distress; alert,appropriate and cooperative throughout examination Head:  Normocephalic and atraumatic without obvious abnormalities. No apparent alopecia or balding. Eyes:  No corneal or conjunctival inflammation noted. EOMI. Perrla. Funduscopic exam benign, without hemorrhages, exudates or  papilledema. Vision grossly normal. Ears:  External ear exam shows no significant lesions or deformities.  Otoscopic examination reveals clear canals, tympanic membranes are intact bilaterally without bulging, retraction, inflammation or discharge. Hearing is grossly normal bilaterally. Nose:  External nasal examination shows no deformity or inflammation. Nasal mucosa are pink and moist without lesions or exudates. Mouth:  Oral mucosa and oropharynx without lesions or exudates.  Teeth in good repair. Neck:  No deformities, masses, or tenderness noted. Lungs:  Normal respiratory effort, chest expands symmetrically. Lungs are clear to auscultation, no crackles or wheezes.   Impression & Recommendations:  Problem # 1:  ACUTE SINUSITIS, UNSPECIFIED (ICD-461.9)  Her updated medication list for this problem includes:    Veramyst 27.5 Mcg/spray Susp (Fluticasone furoate) .Marland Kitchen... 2 sprays each nostril once daily    Doxycycline Hyclate 100 Mg Caps (Doxycycline hyclate) .Marland Kitchen..Marland Kitchen Two times a day  Complete Medication List: 1)  Proventil Hfa 108 (90 Base) Mcg/act Aers (Albuterol sulfate) .... 2 puffs q4h as needed for sob 2)  Aspir-low 81 Mg Tbec (Aspirin) .Marland Kitchen.. 1 once daily pc 3)  Coreg 6.25 Mg Tabs (Carvedilol) .Marland Kitchen.. 1 by mouth two times a day 4)  Calcium /d Tabs (calcium Carbonate-vitamin D)  .Marland Kitchen.. 1 by mouth two times a day 5)  Alprazolam 1 Mg Tabs (Alprazolam) .... 1/2  by mouth four times a day as needed for anxiety 6)  Lamotrigine 200 Mg Tabs (Lamotrigine) .... Take one tablet by mouth once daily in the am 7)  Omeprazole 40 Mg Cpdr (Omeprazole) .... Once daily 8)  Seroquel Xr 150 Mg Xr24h-tab (Quetiapine fumarate) .Marland Kitchen.. 1 at bedtime 9)  Diovan 40 Mg Tabs (Valsartan) .... Two times a day 10)  Carbidopa-levodopa 10-100 Mg Tabs (Carbidopa-levodopa) .Marland KitchenMarland KitchenMarland Kitchen  1 three times a day 11)  Loratadine 10 Mg Tabs (Loratadine) .Marland Kitchen.. 1 by mouth once daily 12)  Veramyst 27.5 Mcg/spray Susp (Fluticasone furoate) .... 2  sprays each nostril once daily 13)  Doxycycline Hyclate 100 Mg Caps (Doxycycline hyclate) .... Two times a day  Patient Instructions: 1)  Please schedule a follow-up appointment as needed .  Prescriptions: DOXYCYCLINE HYCLATE 100 MG CAPS (DOXYCYCLINE HYCLATE) two times a day  #20 x 0   Entered and Authorized by:   Nelwyn [place] MD   Signed by:   Nelwyn [place] MD on 10/03/2009   Method used:   Electronically to        [facility]  [address] [id number]* (retail)       [address]       [place], [place]  [address]       Ph: [phone] or [phone]       Fax: [phone]   RxID:   [id number]

## 2010-07-24 NOTE — Consult Note (Signed)
Summary: [facility]  [facility]   Imported By: Maryln Gottron 11/09/2009 13:12:36  _____________________________________________________________________  External Attachment:    Type:   Image     Comment:   External Document

## 2010-07-24 NOTE — Progress Notes (Signed)
Summary: chest cold  Phone Note Call from Patient Call back at [phone]   Caller: VM Summary of Call: At [facility] suggestion as decongestant/chest cold not working as I hoped it would.  NO fever.  Cough from chest some, sometimes mucus, pale brown.  No short breath.   Something else Rx or not.  RA [place] [phone] Rudy Jew, RN  October 10, 2009 12:02 PM    Initial call taken by: Rudy Jew, RN,  October 10, 2009 10:14 AM  Follow-up for Phone Call        call in Hydromet 1 tsp q 4 hours as needed cough, 240 ml, no rf Follow-up by: Nelwyn [place] MD,  October 10, 2009 2:01 PM  Additional Follow-up for Phone Call Additional follow up Details #1::        Rx Called In Additional Follow-up by: Raechel Ache, RN,  October 10, 2009 3:30 PM

## 2010-07-24 NOTE — Assessment & Plan Note (Signed)
Summary: ? cyst on back/dm   Vital Signs:  Patient profile:   65 year old female Weight:      142 pounds Temp:     98.3 degrees F oral BP sitting:   88 / 60  (left arm) Cuff size:   regular  Vitals Entered By: Raechel Ache, RN (January 01, 2010 2:14 PM) CC: C/o ?growth base of spine which is sore.   History of Present Illness: Here with her sister for 4 weeks of sharp pains in the tailbone. This especially bothers her in bad at night. It can be quite painful to turn over. No trouble with BMs. No recent trauma.   Allergies: 1)  ! * Aricept 2)  ! * Tramadol  Past History:  Past Medical History: Reviewed history from 12/08/2009 and no changes required. 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 EF previously 25% Echo 1/10: EF 40%. moderate MR.     b. Coronaries ok by cath 11/08 2) Moderate to severe mitral regurgitation 3) depression, sees Dr. Emerson Monte 4) Asthma/allergies followed by Dr. Delton Coombes 5) h/o nephrolithiasis 6) HTN 7) Hyperlipidemia 8) GERD 9) knee pain 10) anemia dementia GLAUCOMA (ICD-365.9) OSTEOARTHRITIS (ICD-715.90), sees Dr. Darlina Sicilian (Orthopedics) in [facility] HYPOTHYROIDISM (ICD-244.9) COLONIC POLYPS, HX OF (ICD-V12.72) Parkinsons disease, sees Dr. Louanna Raw  at [facility]  Past Surgical History: Reviewed history from 12/08/2009 and no changes required. Cholecystectomy Tonsillectomy cardiac catheterization 11-08, normal coronaries Colonoscopy 03-2007, AVM's per Dr. Elnoria Howard right knee arthroscopy Synvisc injections to the knees  Review of Systems  The patient denies anorexia, fever, weight loss, weight gain, vision loss, decreased hearing, hoarseness, chest pain, syncope, dyspnea on exertion, peripheral edema, prolonged cough, headaches, hemoptysis, abdominal pain, melena, hematochezia, severe indigestion/heartburn, hematuria, incontinence, genital sores, muscle weakness, suspicious skin lesions, transient blindness, difficulty walking, depression,  unusual weight change, abnormal bleeding, enlarged lymph nodes, angioedema, breast masses, and testicular masses.    Physical Exam  General:  Well-developed,well-nourished,in no acute distress; alert,appropriate and cooperative throughout examination Rectal:  No external abnormalities noted. Normal sphincter tone. No rectal masses or tenderness. Msk:  tender over the coccyx, no masses felt, no cystic areas.  Skin:  the intergluteal fold has an area of macular erythema superior to the anus   Impression & Recommendations:  Problem # 1:  COCCYGEAL PAIN (ICD-724.79)  Orders: T-Coccyx/Sacrum 2 Views (72220TC)  Problem # 2:  TINEA CRURIS (ICD-110.3)  Complete Medication List: 1)  Proventil Hfa 108 (90 Base) Mcg/act Aers (Albuterol sulfate) .... 2 puffs q4h as needed for sob 2)  Aspir-low 81 Mg Tbec (Aspirin) .Marland Kitchen.. 1 once daily pc 3)  Coreg 6.25 Mg Tabs (Carvedilol) .Marland Kitchen.. 1 by mouth two times a day 4)  Calcium /d Tabs (calcium Carbonate-vitamin D)  .Marland Kitchen.. 1 by mouth two times a day 5)  Alprazolam 1 Mg Tabs (Alprazolam) .... 1/2  by mouth four times a day as needed for anxiety 6)  Lamotrigine 200 Mg Tabs (Lamotrigine) .... Take one tablet by mouth once daily in the am 7)  Omeprazole 40 Mg Cpdr (Omeprazole) .... Once daily 8)  Seroquel Xr 150 Mg Xr24h-tab (Quetiapine fumarate) .Marland Kitchen.. 1 at bedtime 9)  Carbidopa-levodopa 10-100 Mg Tabs (Carbidopa-levodopa) .Marland Kitchen.. 1 three times a day 10)  Loratadine 10 Mg Tabs (Loratadine) .Marland Kitchen.. 1 by mouth once daily 11)  Veramyst 27.5 Mcg/spray Susp (Fluticasone furoate) .... 2 sprays each nostril once daily 12)  Diovan 160 Mg Tabs (Valsartan) .Marland Kitchen.. 1 two times a day 13)  Hydrochlorothiazide 25 Mg Tabs (Hydrochlorothiazide) .Marland Kitchen.. 1 once daily 14)  Nuedexta 20-10 Mg Caps (Dextromethorphan-quinidine) .Marland Kitchen.. 1 once daily 15)  Ketoconazole 2 % Crea (Ketoconazole) .... Apply three times a day as needed for rash 16)  Aleve 220 Mg Tabs (Naproxen sodium) .... 2 tabs two times a  day as needed pain  Patient Instructions: 1)  Use ketocoanzole cream for the tinea. Will evaluate the lower spine today with Xrays. Try Aleeve OTC.  Prescriptions: PROVENTIL HFA 108 (90 BASE) MCG/ACT AERS (ALBUTEROL SULFATE) 2 puffs q4h as needed for SOB  #1 x 11   Entered and Authorized by:   Nelwyn [place] MD   Signed by:   Nelwyn [place] MD on 01/01/2010   Method used:   Electronically to        [facility]  [address] [id number]* (retail)       [address]       [place], [place]  [address]       Ph: [phone] or [phone]       Fax: [phone]   RxID:   [id number] KETOCONAZOLE 2 % CREA (KETOCONAZOLE) apply three times a day as needed for rash  #60 x 2   Entered and Authorized by:   Nelwyn [place] MD   Signed by:   Nelwyn [place] MD on 01/01/2010   Method used:   Electronically to        [facility]  [address] [facility]* (retail)       [address]       [place], [place]  [address]       Ph: [phone] or [phone]       Fax: [phone]   RxID:   [id number]

## 2010-07-24 NOTE — Assessment & Plan Note (Signed)
Summary: 6 month rov/sl      Allergies Added:   Referring Provider:  Noelani Harbach Primary Provider:  Nelwyn [place] MD  CC:  slight CP on left side.  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 Also with asthma and severe depression/memory losss. Returns for routine f/u.  Echo in August 2010 which showed stable EF 40-45% with moderate MR. LV dimensions stable.   Over past 2 weeks dementia sees to be accelerating quickly. Now looking at assisted living centers.   Feels fatigued.   Current Medications (verified): 1)  Proair Hfa 108 (90 Base) Mcg/act Aers (Albuterol Sulfate) .... Inhale 2 Puff As Directed Every Four Hours 2)  Aspir-Low 81 Mg Tbec (Aspirin) .Marland Kitchen.. 1 Once Daily Pc 3)  Diovan 160 Mg Tabs (Valsartan) .... Take 1 Tablet By Mouth Two Times A Day 4)  Coreg 6.25 Mg  Tabs (Carvedilol) .Marland Kitchen.. 1 By Mouth Two Times A Day 5)  Calcium /d   Tabs (Calcium Carbonate-Vitamin D) .Marland Kitchen.. 1 By Mouth Two Times A Day 6)  Alprazolam 1 Mg  Tabs (Alprazolam) .Marland Kitchen.. 1 By Mouth Four Times A Day As Needed For Anxiety 7)  Etodolac 500 Mg Tabs (Etodolac) .... Two Times A Day 8)  Seroquel Xr 400 Mg Xr24h-Tab (Quetiapine Fumarate) .... At Bedtime 9)  Lamotrigine 200 Mg Tabs (Lamotrigine) .... Take One Tablet By Mouth Once Daily in The Am 10)  Symbicort 80-4.5 Mcg/act Aero (Budesonide-Formoterol Fumarate) .... 2 Puffs Two Times A Day 11)  Prenavite Multiple Vitamin 28-0.8 Mg Tabs (Prenatal Vit-Fe Fumarate-Fa) .Marland Kitchen.. 1 Once Daily 12)  Hydrochlorothiazide 25 Mg Tabs (Hydrochlorothiazide) .... Take One Tablet By Mouth Daily. 13)  Omeprazole 40 Mg Cpdr (Omeprazole) .... Once Daily  Allergies (verified): 1)  ! * Aricept 2)  ! * Tramadol  Review of Systems       As per HPI and past medical history; otherwise all systems negative.   Vital Signs:  Patient profile:   65 year old female Height:      65 inches Weight:      155  pounds BMI:     25.89 Pulse rate:   73 / minute BP sitting:   108 / 64  (left arm) Cuff size:   regular  Vitals Entered By: Hardin Negus, RMA (August 02, 2009 3:40 PM)  Physical Exam  General:  HEENT:  Normal. NECK:  Supple.  No JVD.  Carotids are 2+ bilaterally without any bruits. There is no lymphadenopathy or thyromegaly. CARDIAC:  PMI is nondisplaced.  She is regular rate and rhythm.  2/6 SEM at RSB. No obvious MR No S3. LUNGS:  Clear.  No wheezing. ABDOMEN:  Soft, nontender, and nondistended.   No masses. EXTREMITIES:  Warm with no cyanosis, clubbing, or edema.  No rash. NEURO:  Alert.  Cranial nerves II-XII are intact.  Moves all 4 extremities without difficulty.  Tearful   Impression & Recommendations:  Problem # 1:  CONGESTIVE HEART FAILURE, SYSTOLIC, CHRONIC (ICD-428.0) Volume status looks good. Very well compnesated. EF much improved. Continue medical therapy.  Problem # 2:  MITRAL REGURGITATION (ICD-396.3) Stable. Continue afterload reduction. Not surgical candidate due to dementia even if valve were to get worse.  Other Orders: EKG w/ Interpretation (93000)

## 2010-07-24 NOTE — Assessment & Plan Note (Signed)
Summary: VCD, allergies   Visit Type:  Follow-up Copy to:  [facility] Primary Provider/Referring Provider:  Nelwyn Salisbury MD  CC:  Allergic rhinitis and asthma follow-up...the patient c/o cough and wheezing...wheezing is worse at night.  History of Present Illness: Ms. Helfand is a very pleasant 65 year old with a history of gastroesophageal reflux disease, asthma, hyperlipidemia,attention deficit disorder and iron-deficiency anemia thought secondary to AVMs.  She was admitted to the hospital at the end of 08 with acute heart failure symptoms.  Echo showed an EF of about 30- 40%.    ROV 10/26/08 -- follows up today after 1 month. Last visit we restarted Symbicort, also restarted Nasacort (no loratadine). Has had significant improvement in her breathing, able to exert more easily and is walking with her friends and sister, wheezing has decreased significantly. Still has hoarseness and upper airway noise. Remains on protonix.   ROV 09/13/09 -- Returns for follow up. Has been unsteady on her feet, fell in 2/11 and was admitted to [facility]. Has been dx with Parkinson's with dementia. She is having more trouble with nasal gtt, UA irritation. Ran out of Symbicort 3 mo ago and has not restarted. Hears raspy voice but not dyspneic.   ROV 03/19/10 -- 65 yo woman, hx GERD, VCD > asthma, cough, allergies. last time we stopped Symbicort, planned to use albuterol as needed and control other factors that irritate the UA. Started loratadine and Veramyst, using prn. Has been having more trouble with drainage and congestion, cough over last 3 weeks. Hasn't missed the Symbicort.   Current Medications (verified): 1)  Proventil Hfa 108 (90 Base) Mcg/act Aers (Albuterol Sulfate) .... 2 Puffs Q4h As Needed For Sob 2)  Aspir-Low 81 Mg Tbec (Aspirin) .Marland Kitchen.. 1 Once Daily Pc 3)  Coreg 6.25 Mg  Tabs (Carvedilol) .Marland Kitchen.. 1 By Mouth Two Times A Day 4)  Calcium /d   Tabs (Calcium Carbonate-Vitamin D) .Marland Kitchen.. 1 By Mouth Two Times A Day 5)   Alprazolam 1 Mg  Tabs (Alprazolam) .... 1/2  By Mouth Four Times A Day As Needed For Anxiety 6)  Lamotrigine 200 Mg Tabs (Lamotrigine) .... Take One Tablet By Mouth Once Daily in The Am 7)  Omeprazole 40 Mg Cpdr (Omeprazole) .... Once Daily 8)  Seroquel Xr 150 Mg Xr24h-Tab (Quetiapine Fumarate) .Marland Kitchen.. 1 At Bedtime 9)  Carbidopa-Levodopa 10-100 Mg Tabs (Carbidopa-Levodopa) .Marland Kitchen.. 1 1/2 By Mouth Three Times A Day 10)  Loratadine 10 Mg Tabs (Loratadine) .Marland Kitchen.. 1 By Mouth Once Daily 11)  Veramyst 27.5 Mcg/spray Susp (Fluticasone Furoate) .... 2 Sprays Each Nostril Once Daily 12)  Diovan 160 Mg Tabs (Valsartan) .Marland Kitchen.. 1 Two Times A Day 13)  Hydrochlorothiazide 25 Mg Tabs (Hydrochlorothiazide) .Marland Kitchen.. 1 Once Daily 14)  Nuedexta 20-10 Mg Caps (Dextromethorphan-Quinidine) .Marland Kitchen.. 1 By Mouth Two Times A Day 15)  Ketoconazole 2 % Crea (Ketoconazole) .... Apply Three Times A Day As Needed For Rash 16)  Aleve 220 Mg Tabs (Naproxen Sodium) .... 2 Tabs Two Times A Day As Needed Pain  Allergies (verified): 1)  ! * Aricept 2)  ! * Tramadol  Vital Signs:  Patient profile:   65 year old female Height:      65 inches (165.10 cm) Weight:      144.38 pounds (65.63 kg) BMI:     24.11 O2 Sat:      100 % on Room air Temp:     97.5 degrees F (36.39 degrees C) oral Pulse rate:   74 / minute BP sitting:  110 / 72  (right arm) Cuff size:   regular  Vitals Entered By: Michel Bickers CMA (March 19, 2010 2:36 PM)  O2 Sat at Rest %:  100 O2 Flow:  Room air CC: Allergic rhinitis and asthma follow-up...the patient c/o cough and wheezing...wheezing is worse at night Comments Medications reviewed with patient Daytime phone verified. Michel Bickers [facility]  March 19, 2010 2:44 PM   Physical Exam  General:  no distress Head:  normocephalic and atraumatic Eyes:  conjunctiva and sclera clear Nose:  no deformity, discharge, inflammation, or lesions Mouth:  no deformity or lesions Lungs:  Normal respiratory effort, chest  expands symmetrically. Lungs are clear to auscultation, no crackles or wheezes. Heart:  normal rate, regular rhythm, no gallop, no rub, and no JVD.  Has 3/6 SM loudest at the mitral area. Abdomen:  not examined Msk:  no deformity or scoliosis noted with normal posture Extremities:  no clubbing, cyanosis, edema, or deformity noted Neurologic:  alert & oriented X3.  Sometimes has slight difficulties with getting her words out.  Psych:  flat affect, difficulty with simple history giving - she is counting on her friend to answer questions about symptoms,   Impression & Recommendations:  Problem # 1:  ASTHMA (ICD-493.90) With VCD. Her UA symptoms predominate, are worse during spring and falll months - control allergies - as needed SABA - ROV 6 mo  Problem # 2:  ALLERGIC RHINITIS (ICD-477.9)  - substitute fluticasone for veramyst and use every day - loratadine once daily   Orders: Est. Patient Level IV ([address])  Medications Added to Medication List This Visit: 1)  Carbidopa-levodopa 10-100 Mg Tabs (Carbidopa-levodopa) .Marland Kitchen.. 1 1/2 by mouth three times a day 2)  Fluticasone Propionate 50 Mcg/act Susp (Fluticasone propionate) .... 2 sprays each nostril once daily 3)  Nuedexta 20-10 Mg Caps (Dextromethorphan-quinidine) .Marland Kitchen.. 1 by mouth two times a day  Patient Instructions: 1)  Continue your Proventil as needed  2)  Continue loratadine once daily  3)  Start fluticasone nasal spray, 2 sprays each nostril once daily  4)  Follow up with [name] in 6 months or as needed for any problems.  Prescriptions: FLUTICASONE PROPIONATE 50 MCG/ACT SUSP (FLUTICASONE PROPIONATE) 2 sprays each nostril once daily  #1 x 5   Entered and Authorized by:   Leslye Peer MD   Signed by:   Leslye Peer MD on 03/19/2010   Method used:   Electronically to        [facility]  [address] # [id number]* (retail)       [address]       [place], [place]  [address]       Ph: [phone] or  [phone]       Fax: [phone]   RxID:   [id number]

## 2010-07-24 NOTE — Assessment & Plan Note (Addendum)
Summary: f57m  Medications Added CALCIUM CARBONATE-VITAMIN D 600-400 MG-UNIT  TABS (CALCIUM CARBONATE-VITAMIN D) once daily PRE-NATAL FORMULA  TABS (PRENATAL MULTIVIT-MIN-FE-FA) once daily TRAVATAN Z 0.004 % SOLN (TRAVOPROST) once daily      Allergies Added:   Visit Type:  Follow-up Referring Provider:  [facility] Primary Provider:  Nelwyn Salisbury MD  CC:  no complaints.  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 Also with asthma and Parkinson's disease and dementia. Returns for routine f/u.  Echo in August 2010 which showed stable EF 45% with mild to moderate MR. LV dimensions stable.   More SOB lately. Feels it is likely related to allergies/asthma. Saw Dr. Delton Coombes last week who changed her regimen with good response. Mild wheezing but improved. No edema, orthopnea or No CP.    Getting good response for Sinemet. Thorough dementia has progressed. Very emotional.       Current Medications (verified): 1)  Proventil Hfa 108 (90 Base) Mcg/act Aers (Albuterol Sulfate) .... 2 Puffs Q4h As Needed For Sob 2)  Aspir-Low 81 Mg Tbec (Aspirin) .Marland Kitchen.. 1 Once Daily Pc 3)  Coreg 6.25 Mg  Tabs (Carvedilol) .Marland Kitchen.. 1 By Mouth Two Times A Day 4)  Calcium /d   Tabs (Calcium Carbonate-Vitamin D) .Marland Kitchen.. 1 By Mouth Two Times A Day 5)  Alprazolam 1 Mg  Tabs (Alprazolam) .... 1/2  By Mouth Four Times A Day As Needed For Anxiety 6)  Lamotrigine 200 Mg Tabs (Lamotrigine) .... Take One Tablet By Mouth Once Daily in The Am 7)  Omeprazole 40 Mg Cpdr (Omeprazole) .... Once Daily 8)  Seroquel Xr 150 Mg Xr24h-Tab (Quetiapine Fumarate) .Marland Kitchen.. 1 At Bedtime 9)  Carbidopa-Levodopa 10-100 Mg Tabs (Carbidopa-Levodopa) .Marland Kitchen.. 1 1/2 By Mouth Three Times A Day 10)  Loratadine 10 Mg Tabs (Loratadine) .Marland Kitchen.. 1 By Mouth Once Daily 11)  Fluticasone Propionate 50 Mcg/act Susp (Fluticasone Propionate) .... 2 Sprays Each Nostril Once Daily 12)  Diovan 160 Mg  Tabs (Valsartan) .Marland Kitchen.. 1 Two Times A Day 13)  Hydrochlorothiazide 25 Mg Tabs (Hydrochlorothiazide) .Marland Kitchen.. 1 Once Daily 14)  Nuedexta 20-10 Mg Caps (Dextromethorphan-Quinidine) .Marland Kitchen.. 1 By Mouth Two Times A Day 15)  Aleve 220 Mg Tabs (Naproxen Sodium) .... 2 Tabs Two Times A Day As Needed Pain 16)  Calcium Carbonate-Vitamin D 600-400 Mg-Unit  Tabs (Calcium Carbonate-Vitamin D) .... Once Daily 17)  Pre-Natal Formula  Tabs (Prenatal Multivit-Min-Fe-Fa) .... Once Daily 18)  Travatan Z 0.004 % Soln (Travoprost) .... Once Daily  Allergies (verified): 1)  ! * Aricept 2)  ! * Tramadol  Review of Systems       As per HPI and past medical history; otherwise all systems negative.   Vital Signs:  Patient profile:   65 year old female Height:      65 inches Weight:      141 pounds BMI:     23.55 Pulse rate:   69 / minute BP sitting:   106 / 58  (right arm) Cuff size:   regular  Vitals Entered By: Hardin Negus, RMA (March 27, 2010 11:08 AM)  Physical Exam  General:   She is in no acute distress, confused at times ambulates around the clinic slowly without any respiratory distress. HEENT:  Normal. NECK:  Supple.  JVP 5.  Carotids are 2+ bilaterally without bruits.  There is no lymphadenopathy or thyromegaly. CARDIAC:  PMI is nondisplaced.  She has  a regular rate and rhythm very soft MR murmur.  There is no S3. LUNGS:  Clear. ABDOMEN:  Soft, nontender and non-distended.  No hepatosplenomegaly.  No bruits.  No masses.  Good bowel sounds. EXTREMITIES:  Warm with no cyanosis, clubbing or edema.  No rash. NEUROLOGIC:  Alert and oriented x3.  Cranial nerves II-XII are intact. She moves all 4 extremities without difficulty.  Affect is improved. Not tearful    Impression & Recommendations:  Problem # 1:  MITRAL REGURGITATION (ICD-396.3) Very mild on exam. Does not appear to have signifcant HF. Will continue current regimen. If symptoms worsen can repeat echo. Not surgical candidate.  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 On decent regimen. No volume overload. Continue current treatment.  Problem # 3:  DYSPNEA (ICD-786.05) Currently expect this is mostly due to asthma/allergies. No overt HF.   Other Orders: EKG w/ Interpretation (93000)  Patient Instructions: 1)  Your physician wants you to follow-up in: 6 months.   You will receive a reminder letter in the mail two months in advance. If you don't receive a letter, please call our office to schedule the follow-up appointment.

## 2010-07-24 NOTE — Letter (Signed)
Summary: [facility]-Dr. Koren Bound Lucky-Dr. Andreas Blower   Imported By: Maryln Gottron 09/06/2009 11:08:40  _____________________________________________________________________  External Attachment:    Type:   Image     Comment:   External Document

## 2010-07-24 NOTE — Assessment & Plan Note (Signed)
Summary: HOS FU/NJR   Vital Signs:  Patient profile:   65 year old female Weight:      150 pounds BMI:     25.05 Temp:     98.2 degrees F oral Pulse rate:   72 / minute Pulse rhythm:   regular BP sitting:   124 / 84  (left arm) Cuff size:   regular  Vitals Entered By: Raechel Ache, RN (September 04, 2009 10:55 AM) CC: Hosp f/u.  D/C'd from Rehab yesterday- staying with sister.   History of Present Illness: Here to follow up after a hospital stay from 08-06-09 to 08-16-09 for recurrent falls. A head CT was normal. A brain MRI showed global atrophy but no discrete lesions. She has a hx of dementia that had been ascribed to Alzheimers, but during this hosptial stay her gait and her tremors suggested she may have Parkinsonism. She was started on Levadopa-carbidopa to see if this would help her tendency to fall. After DC she was sent to [place] from 08-16-09 until yesterday for rehab. Now she is living with her sister until they can place her in an assisted living facility. According to her sister, almost immediately after getting on Sinimet, her confusion improved and her unsteady gait improved. It seems obvious that she does have an element of parkinsonism, but it is unclear as to whether Alzheimers is playing a role as well or not. Today she has no complaints. She had seen Dr. Vickey Huger in the past, but now they would like to establish with a new neurologist for a fresh start.   Allergies: 1)  ! * Aricept 2)  ! 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 EF previously 25% Echo 1/10: EF 40%. moderate MR.     b. Coronaries ok by cath 11/08 2) Moderate to severe mitral regurgitation 3) depression, sees Dr. Emerson Monte 4) Asthma/allergies followed by Dr. Delton Coombes 5) h/o nephrolithiasis 6) HTN 7) Hyperlipidemia 8) GERD 9) knee pain 10) anemia dementia GLAUCOMA (ICD-365.9) OSTEOARTHRITIS (ICD-715.90) HYPOTHYROIDISM (ICD-244.9) COLONIC  POLYPS, HX OF (ICD-V12.72) Parkinsons disease  Past Surgical History: Reviewed history from 04/15/2008 and no changes required. Cholecystectomy Tonsillectomy cardiac catheterization 11-08, normal coronaries Colonoscopy 03-2007, AVM's per Dr. Elnoria Howard right knee arthroscopy  Review of Systems  The patient denies anorexia, fever, weight loss, weight gain, vision loss, decreased hearing, hoarseness, chest pain, syncope, dyspnea on exertion, peripheral edema, prolonged cough, headaches, hemoptysis, abdominal pain, melena, hematochezia, severe indigestion/heartburn, hematuria, incontinence, genital sores, muscle weakness, suspicious skin lesions, transient blindness, difficulty walking, depression, unusual weight change, abnormal bleeding, enlarged lymph nodes, angioedema, breast masses, and testicular masses.    Physical Exam  General:  Well-developed,well-nourished,in no acute distress; alert,appropriate and cooperative throughout examination Neck:  No deformities, masses, or tenderness noted. Lungs:  Normal respiratory effort, chest expands symmetrically. Lungs are clear to auscultation, no crackles or wheezes. Heart:  Normal rate and regular rhythm. S1 and S2 normal without gallop, murmur, click, rub or other extra sounds. Neurologic:  alert but not oriented to date or place. cranial nerves II-XII intact and strength normal in all extremities.  Gait slightly unsteady but walsk unassisted. She is confused today, and she takes a long time to put her thoughts into words. her speech is clear however, and she asks appropriate questions.  Psych:  moderately anxious.     Impression & Recommendations:  Problem # 1:  PARKINSON'S DISEASE (ICD-332.0)  Orders: Neurology Referral (Neuro)  Problem # 2:  DEMENTIA (ICD-294.8)  Problem # 3:  HYPERTENSION (ICD-401.9)  The following medications were removed from the medication list:    Diovan 160 Mg Tabs (Valsartan) .Marland Kitchen... Take 1 tablet by mouth two  times a day    Hydrochlorothiazide 25 Mg Tabs (Hydrochlorothiazide) .Marland Kitchen... Take one tablet by mouth daily. Her updated medication list for this problem includes:    Coreg 6.25 Mg Tabs (Carvedilol) .Marland Kitchen... 1 by mouth two times a day    Diovan 40 Mg Tabs (Valsartan) .Marland Kitchen... 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 Take 1 tablet by mouth two times a day    Hydrochlorothiazide 25 Mg Tabs (Hydrochlorothiazide) .Marland Kitchen... Take one tablet by mouth daily. Her updated medication list for this problem includes:    Aspir-low 81 Mg Tbec (Aspirin) .Marland Kitchen... 1 once daily pc    Coreg 6.25 Mg Tabs (Carvedilol) .Marland Kitchen... 1 by mouth two times a day    Diovan 40 Mg Tabs (Valsartan) .Marland Kitchen... 1 once daily  Problem # 5:  HYPOTHYROIDISM (ICD-244.9)  Problem # 6:  ASTHMA (ICD-493.90)  Her updated medication list for this problem includes:    Proair Hfa 108 (90 Base) Mcg/act Aers (Albuterol sulfate) ..... Inhale 2 puff as directed every four hours    Symbicort 80-4.5 Mcg/act Aero (Budesonide-formoterol fumarate) .Marland Kitchen... 2 puffs two times a day  Complete Medication List: 1)  Proair Hfa 108 (90 Base) Mcg/act Aers (Albuterol sulfate) .... Inhale 2 puff as directed every four hours 2)  Aspir-low 81 Mg Tbec (Aspirin) .Marland Kitchen.. 1 once daily pc 3)  Coreg 6.25 Mg Tabs (Carvedilol) .Marland Kitchen.. 1 by mouth two times a day 4)  Calcium /d Tabs (calcium Carbonate-vitamin D)  .Marland Kitchen.. 1 by mouth two times a day 5)  Alprazolam 1 Mg Tabs (Alprazolam) .... 1/2  by mouth four times a day as needed for anxiety 6)  Lamotrigine 200 Mg Tabs (Lamotrigine) .... Take one tablet by mouth once daily in the am 7)  Symbicort 80-4.5 Mcg/act Aero (Budesonide-formoterol fumarate) .... 2 puffs two times a day 8)  Omeprazole 40 Mg Cpdr (Omeprazole) .... Once daily 9)  Seroquel Xr 150 Mg Xr24h-tab (Quetiapine fumarate) .Marland Kitchen.. 1 at  bedtime 10)  Diovan 40 Mg Tabs (Valsartan) .Marland Kitchen.. 1 once daily 11)  Carbidopa-levodopa 10-100 Mg Tabs (Carbidopa-levodopa) .Marland Kitchen.. 1 three times a day  Patient Instructions: 1)  She clearly has Parkinsonian symptoms which have responded to treatment with Sinimet. It is difficult to say whether there is another form of dementia, such as Alzheimers, also involved or not. We will refer her to Neurology at [facility] to help sort this out.

## 2010-07-24 NOTE — Progress Notes (Signed)
Summary: prescription  Phone Note Call from Patient Call back at [phone]   Caller: Patient Call For: byrum Reason for Call: Talk to Nurse Summary of Call: Need rx for veramyst. [facility] [address] Initial call taken by: Darletta Moll,  October 02, 2009 2:24 PM  Follow-up for Phone Call        Per pt's last ov note on 09/13/09 with RB, pt was told to try veramyst and if it helps then to call office for rx.  Called spoke with pt.  Pt informed veramyst rx sent to [facility]  Pt also requesting sample of veramyst.  1 sample placed at front for pt to pick up- she is aware of this.  Gweneth Dimitri RN  October 02, 2009 3:40 PM     New/Updated Medications: VERAMYST 27.5 MCG/SPRAY SUSP (FLUTICASONE FUROATE) 2 sprays each nostril once daily Prescriptions: VERAMYST 27.5 MCG/SPRAY SUSP (FLUTICASONE FUROATE) 2 sprays each nostril once daily  #1 x 3   Entered by:   Gweneth Dimitri RN   Authorized by:   Leslye Peer MD   Signed by:   Gweneth Dimitri RN on 10/02/2009   Method used:   Electronically to        [facility] # [id number]* (retail)       [address]       [place], [place]  [address]       Ph: [phone] or [phone]       Fax: [phone]   RxID:   [id number]

## 2010-07-24 NOTE — Progress Notes (Signed)
Summary: new rx needed  Phone Note Call from Patient Call back at [phone]   Caller: Patient---live call Summary of Call: Was given rx for Sinemet in the hosp. Pt said that [name] would rx this. [facility] call [facility].  She is on 100mg  TId.  Initial call taken by: Warnell Forester,  September 11, 2009 2:40 PM  Follow-up for Phone Call        I will prescribe this until she sees the Neurologist (Dr. Zola Button on 10-19-09), and then he will take over. Call in Sinemet 10-100 three times a day , #90 with 2 rf Follow-up by: Nelwyn Salisbury MD,  September 11, 2009 3:38 PM  Additional Follow-up for Phone Call Additional follow up Details #1::        Rx Called In Additional Follow-up by: Raechel Ache, RN,  September 11, 2009 3:48 PM    Prescriptions: CARBIDOPA-LEVODOPA 10-100 MG TABS (CARBIDOPA-LEVODOPA) 1 three times a day  #90 x 2   Entered by:   Raechel Ache, RN   Authorized by:   Nelwyn Salisbury MD   Signed by:   Raechel Ache, RN on 09/11/2009   Method used:   Historical   RxID:   [id number]

## 2010-07-24 NOTE — Assessment & Plan Note (Signed)
Summary: 3 MONTH ROV/NJR rsc bmp/njr---PT [facility] // RS pt rsc./njr   Vital Signs:  Patient profile:   66 year old female Weight:      138 pounds BMI:     23.05 BP sitting:   100 / 74  (left arm) Cuff size:   regular  Vitals Entered By: Raechel Ache, RN (December 08, 2009 11:48 AM) CC: ROV.   History of Present Illness: Here with her sister for follow up. In general she seems to be doing fairly well. She saw [facility] in April, and will see them again next week. They kept her on the same dose of Sinemet that we had her on. Her sister has noticed a bit more tremors lately then before. Her moods are much better controlled since she was started on Nuedexta one month ago by Dr. Nolen Mu. She has fewer mood swings and fewer crying spells now, and they are very pleased with the results. She will see Dr. Nolen Mu again in several weeks. Her appetite is good, and she is sleeping well.   Allergies: 1)  ! * Aricept 2)  ! 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 EF previously 25% Echo 1/10: EF 40%. moderate MR.     b. Coronaries ok by cath 11/08 2) Moderate to severe mitral regurgitation 3) depression, sees Dr. Emerson Monte 4) Asthma/allergies followed by Dr. Delton Coombes 5) h/o nephrolithiasis 6) HTN 7) Hyperlipidemia 8) GERD 9) knee pain 10) anemia dementia GLAUCOMA (ICD-365.9) OSTEOARTHRITIS (ICD-715.90), sees Dr. Darlina Sicilian (Orthopedics) in [facility] HYPOTHYROIDISM (ICD-244.9) COLONIC POLYPS, HX OF (ICD-V12.72) Parkinsons disease, sees Dr. Louanna Raw  at [facility]  Past Surgical History: Cholecystectomy Tonsillectomy cardiac catheterization 11-08, normal coronaries Colonoscopy 03-2007, AVM's per Dr. Elnoria Howard right knee arthroscopy Synvisc injections to the knees  Review of Systems  The patient denies anorexia, fever, weight loss, weight gain, vision loss, decreased hearing, hoarseness, chest pain, syncope, dyspnea on exertion,  peripheral edema, prolonged cough, headaches, hemoptysis, abdominal pain, melena, hematochezia, severe indigestion/heartburn, hematuria, incontinence, genital sores, muscle weakness, suspicious skin lesions, transient blindness, difficulty walking, depression, unusual weight change, abnormal bleeding, enlarged lymph nodes, angioedema, breast masses, and testicular masses.    Physical Exam  General:  Well-developed,well-nourished,in no acute distress; alert,appropriate and cooperative throughout examination Neck:  No deformities, masses, or tenderness noted. Lungs:  Normal respiratory effort, chest expands symmetrically. Lungs are clear to auscultation, no crackles or wheezes. Heart:  Normal rate and regular rhythm. S1 and S2 normal without gallop, murmur, click, rub or other extra sounds. Neurologic:  alert & oriented X3 and cranial nerves II-XII intact.  Walks slowly with no assistance, gait is slightly shuffling. Motor strength is intact. She has a few resting tremors of the hands.  Psych:  Oriented X3 and good eye contact.  Gets slightly tearful at times   Impression & Recommendations:  Problem # 1:  PARKINSON'S DISEASE (ICD-332.0)  Problem # 2:  INSOMNIA (ICD-780.52)  Problem # 3:  DEMENTIA (ICD-294.8)  Problem # 4:  CONGESTIVE HEART FAILURE, SYSTOLIC, CHRONIC (ICD-428.0)  The following medications were removed from the medication list:    Diovan 40 Mg Tabs (Valsartan) .Marland Kitchen..Marland Kitchen Two times a day Her updated medication list for this problem includes:    Aspir-low 81 Mg Tbec (Aspirin) .Marland Kitchen... 1 once daily pc    Coreg 6.25 Mg Tabs (Carvedilol) .Marland Kitchen... 1 by mouth two times a day    Diovan 160 Mg Tabs (Valsartan) .Marland KitchenMarland KitchenMarland KitchenMarland Kitchen  1 two times a day    Hydrochlorothiazide 25 Mg Tabs (Hydrochlorothiazide) .Marland Kitchen... 1 once daily  Problem # 5:  HYPERTENSION (ICD-401.9)  The following medications were removed from the medication list:    Diovan 40 Mg Tabs (Valsartan) .Marland Kitchen..Marland Kitchen Two times a day Her updated medication  list for this problem includes:    Coreg 6.25 Mg Tabs (Carvedilol) .Marland Kitchen... 1 by mouth two times a day    Diovan 160 Mg Tabs (Valsartan) .Marland Kitchen... 1 two times a day    Hydrochlorothiazide 25 Mg Tabs (Hydrochlorothiazide) .Marland Kitchen... 1 once daily  Problem # 6:  OSTEOARTHRITIS (ICD-715.90)  Her updated medication list for this problem includes:    Aspir-low 81 Mg Tbec (Aspirin) .Marland Kitchen... 1 once daily pc  Complete Medication List: 1)  Proventil Hfa 108 (90 Base) Mcg/act Aers (Albuterol sulfate) .... 2 puffs q4h as needed for sob 2)  Aspir-low 81 Mg Tbec (Aspirin) .Marland Kitchen.. 1 once daily pc 3)  Coreg 6.25 Mg Tabs (Carvedilol) .Marland Kitchen.. 1 by mouth two times a day 4)  Calcium /d Tabs (calcium Carbonate-vitamin D)  .Marland Kitchen.. 1 by mouth two times a day 5)  Alprazolam 1 Mg Tabs (Alprazolam) .... 1/2  by mouth four times a day as needed for anxiety 6)  Lamotrigine 200 Mg Tabs (Lamotrigine) .... Take one tablet by mouth once daily in the am 7)  Omeprazole 40 Mg Cpdr (Omeprazole) .... Once daily 8)  Seroquel Xr 150 Mg Xr24h-tab (Quetiapine fumarate) .Marland Kitchen.. 1 at bedtime 9)  Carbidopa-levodopa 10-100 Mg Tabs (Carbidopa-levodopa) .Marland Kitchen.. 1 three times a day 10)  Loratadine 10 Mg Tabs (Loratadine) .Marland Kitchen.. 1 by mouth once daily 11)  Veramyst 27.5 Mcg/spray Susp (Fluticasone furoate) .... 2 sprays each nostril once daily 12)  Diovan 160 Mg Tabs (Valsartan) .Marland Kitchen.. 1 two times a day 13)  Hydrochlorothiazide 25 Mg Tabs (Hydrochlorothiazide) .Marland Kitchen.. 1 once daily 14)  Nuedexta 20-10 Mg Caps (Dextromethorphan-quinidine) .Marland Kitchen.. 1 once daily  Patient Instructions: 1)  I asked her sister to ask the Neurologist about the possibility of increasing the Sinemet dose a bit.  2)  Please schedule a follow-up appointment in 3 months .

## 2010-07-24 NOTE — Progress Notes (Signed)
Summary: neurologist  Phone Note Call from Patient Call back at [phone]    Summary of Call: Waiting to hear the name of neurololgist at [facility]. Initial call taken by: Rudy Jew, RN,  September 07, 2009 12:11 PM  Follow-up for Phone Call        Appt Scheduled.  Pt aware. Follow-up by: Corky Mull,  September 08, 2009 8:51 AM

## 2010-07-26 NOTE — Assessment & Plan Note (Signed)
Summary: Acute NP office visit - wheezing   Copy to:  Bensimhon Primary Provider/Referring Provider:  Nelwyn Salisbury MD  CC:  wheezing x2weeks and pt's sister states that when pt becomes agitated she becomes SOB and would like to discuss this.  History of Present Illness: Lorraine [name] is a very pleasant 65 year old with a history of gastroesophageal reflux disease, asthma, hyperlipidemia,attention deficit disorder and iron-deficiency anemia thought secondary to AVMs.  She was admitted to the hospital at the end of 08 with acute heart failure symptoms.  Echo showed an EF of about 30- 40%.    ROV 10/26/08 -- follows up today after 1 month. Last visit we restarted Symbicort, also restarted Nasacort (no loratadine). Has had significant improvement in her breathing, able to exert more easily and is walking with her friends and sister, wheezing has decreased significantly. Still has hoarseness and upper airway noise. Remains on protonix.   ROV 09/13/09 -- Returns for follow up. Has been unsteady on her feet, fell in 2/11 and was admitted to [facility]. Has been dx with Parkinson's with dementia. She is having more trouble with nasal gtt, UA irritation. Ran out of Symbicort 3 mo ago and has not restarted. Hears raspy voice but not dyspneic.   ROV 03/19/10 -- 65 yo woman, hx GERD, VCD > asthma, cough, allergies. last time we stopped Symbicort, planned to use albuterol as needed and control other factors that irritate the UA. Started loratadine and Veramyst, using prn. Has been having more trouble with drainage and congestion, cough over last 3 weeks. Hasn't missed the Symbicort.   June 04, 2010 --Presents for an acute office visit. Complains of wheezing x2weeks, pt's sister states that when pt becomes agitated she becomes SOB. Pt has dementia. Has nasal congestion and drainage. No significant cough or discolored mucus.Sister helps her with history. Denies chest pain,  orthopnea, hemoptysis, fever, n/v/d, edema,  headache,recent travel or antibiotics.    Medications Prior to Update: 1)  Aspir-Low 81 Mg Tbec (Aspirin) .Marland Kitchen.. 1 Once Daily Pc 2)  Coreg 6.25 Mg  Tabs (Carvedilol) .Marland Kitchen.. 1 By Mouth Two Times A Day 3)  Alprazolam 1 Mg  Tabs (Alprazolam) .... 1/2  By Mouth Four Times A Day As Needed For Anxiety 4)  Calcium /d   Tabs (Calcium Carbonate-Vitamin D) .Marland Kitchen.. 1 By Mouth Two Times A Day 5)  Lamotrigine 200 Mg Tabs (Lamotrigine) .... Take One Tablet By Mouth Once Daily in The Am 6)  Omeprazole 40 Mg Cpdr (Omeprazole) .... Once Daily 7)  Seroquel Xr 150 Mg Xr24h-Tab (Quetiapine Fumarate) .Marland Kitchen.. 1 At Bedtime 8)  Carbidopa-Levodopa 10-100 Mg Tabs (Carbidopa-Levodopa) .Marland Kitchen.. 1 1/2 By Mouth Three Times A Day 9)  Loratadine 10 Mg Tabs (Loratadine) .Marland Kitchen.. 1 By Mouth Once Daily 10)  Fluticasone Propionate 50 Mcg/act Susp (Fluticasone Propionate) .... 2 Sprays Each Nostril Once Daily 11)  Diovan 160 Mg Tabs (Valsartan) .Marland Kitchen.. 1 Two Times A Day 12)  Hydrochlorothiazide 25 Mg Tabs (Hydrochlorothiazide) .Marland Kitchen.. 1 Once Daily 13)  Nuedexta 20-10 Mg Caps (Dextromethorphan-Quinidine) .Marland Kitchen.. 1 By Mouth Two Times A Day 14)  Aleve 220 Mg Tabs (Naproxen Sodium) .... 2 Tabs Two Times A Day As Needed Pain 15)  Pre-Natal Formula  Tabs (Prenatal Multivit-Min-Fe-Fa) .... Once Daily 16)  Calcium Carbonate-Vitamin D 600-400 Mg-Unit  Tabs (Calcium Carbonate-Vitamin D) .... Once Daily 17)  Travatan Z 0.004 % Soln (Travoprost) .... Once Daily 18)  Proventil Hfa 108 (90 Base) Mcg/act Aers (Albuterol Sulfate) .... 2 Puffs Q4h As Needed  For Sob  Current Medications (verified): 1)  Proventil Hfa 108 (90 Base) Mcg/act Aers (Albuterol Sulfate) .... 2 Puffs Q4h As Needed For Sob 2)  Aspir-Low 81 Mg Tbec (Aspirin) .Marland Kitchen.. 1 Once Daily Pc 3)  Coreg 6.25 Mg  Tabs (Carvedilol) .Marland Kitchen.. 1 By Mouth Two Times A Day 4)  Alprazolam 1 Mg  Tabs (Alprazolam) .... 1/2  By Mouth Four Times A Day As Needed For Anxiety 5)  Lamotrigine 200 Mg Tabs (Lamotrigine) .... Take One  Tablet By Mouth Once Daily in The Am 6)  Omeprazole 40 Mg Cpdr (Omeprazole) .... Once Daily 7)  Seroquel 200 Mg Tabs (Quetiapine Fumarate) .... Take 1 Tab By Mouth At Bedtime 8)  Carbidopa-Levodopa 10-100 Mg Tabs (Carbidopa-Levodopa) .Marland Kitchen.. 1 1/2 By Mouth Three Times A Day 9)  Loratadine 10 Mg Tabs (Loratadine) .Marland Kitchen.. 1 By Mouth Once Daily 10)  Fluticasone Propionate 50 Mcg/act Susp (Fluticasone Propionate) .... 2 Sprays Each Nostril Once Daily 11)  Diovan 160 Mg Tabs (Valsartan) .Marland Kitchen.. 1 Two Times A Day 12)  Hydrochlorothiazide 25 Mg Tabs (Hydrochlorothiazide) .Marland Kitchen.. 1 Once Daily 13)  Nuedexta 20-10 Mg Caps (Dextromethorphan-Quinidine) .... Take 1 Capsule By Mouth Once A Day 14)  Aleve 220 Mg Tabs (Naproxen Sodium) .... 2 Tabs Two Times A Day As Needed Pain 15)  Calcium Carbonate-Vitamin D 600-400 Mg-Unit  Tabs (Calcium Carbonate-Vitamin D) .... Take 1 Tablet By Mouth Two Times A Day 16)  Pre-Natal Formula  Tabs (Prenatal Multivit-Min-Fe-Fa) .... Once Daily 17)  Travatan Z 0.004 % Soln (Travoprost) .... Once Daily  Allergies (verified): 1)  ! * Aricept 2)  ! 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 EF previously 25% Echo 1/10: EF 40%. moderate MR.     b. Coronaries ok by cath 11/08 2) Moderate to severe mitral regurgitation 3) depression, sees Dr. Emerson Monte 4) Asthma/allergies followed by Dr. Delton Coombes 5) h/o nephrolithiasis 6) HTN 7) Hyperlipidemia 8) GERD 9) knee pain 10) anemia dementia GLAUCOMA (ICD-365.9) OSTEOARTHRITIS (ICD-715.90), sees Dr. Darlina Sicilian (Orthopedics) in [facility] HYPOTHYROIDISM (ICD-244.9) COLONIC POLYPS, HX OF (ICD-V12.72) Parkinsons disease, sees Dr. Louanna Raw  at [facility]  Past Surgical History: Last updated: 12/08/2009 Cholecystectomy Tonsillectomy cardiac catheterization 11-08, normal coronaries Colonoscopy 03-2007, AVM's per Dr. Elnoria Howard right knee arthroscopy Synvisc  injections to the knees  Family History: Last updated: 05/12/2007 Family History of Arthritis Family History Breast cancer 1st degree relative <50 Family History Diabetes 1st degree relative Family History High cholesterol Family History of Stroke M 1st degree relative <50  Social History: Last updated: 06/04/2010 Divorced Never Smoked Alcohol use-no Drug use-no Regular exercise-no pt lives with sister, Lorraine [name] - pt has dementia  Risk Factors: Smoking Status: never (05/12/2007)  Social History: Divorced Never Smoked Alcohol use-no Drug use-no Regular exercise-no pt lives with sister, Lorraine [name] - pt has dementia  Review of Systems      See HPI  Vital Signs:  Patient profile:   65 year old female Height:      65 inches Weight:      144.38 pounds BMI:     24.11 O2 Sat:      100 % on Room air Temp:     96.9 degrees F oral Pulse rate:   73 / minute BP sitting:   100 / 50  (left arm) Cuff size:   regular  Vitals Entered By: Boone Master CNA/MA (June 04, 2010 10:27 AM)  O2  Flow:  Room air CC: wheezing x2weeks, pt's sister states that when pt becomes agitated she becomes SOB and would like to discuss this Is Patient Diabetic? No Comments Medications reviewed with patient Daytime contact number verified with patient. Boone Master CNA/MA  June 04, 2010 10:29 AM    Physical Exam  Additional Exam:  .GEN: A/Ox3; pleasant , NAD HEENT:  [place]/AT, , EACs-clear, TMs-wnl, NOSE-clear, THROAT-clear NECK:  Supple w/ fair ROM; no JVD; normal carotid impulses w/o bruits; no thyromegaly or nodules palpated; no lymphadenopathy. RESP  CTA bilaterally w/ no wheezing,  faint psuedo wheeze on forced expiration.  CARD:  RRR, no m/r/g   GI:   Soft & nt; nml bowel sounds; no organomegaly or masses detected. [place]: Warm bil,  no calf tenderness edema, clubbing, pulses intact Neuro: intact w/ no focal deficits noted.    Impression & Recommendations:  Problem # 1:   ASTHMA (ICD-493.90) VCD vs reactive airways. She is completely clear in office today Does have very faint psuedo wheeze on forced expiration.  Sounds anxiety related with VCD features ,  in office she did pursed lip breathing w/ total resolution of psuedowheezing.  advised on relaxation -slowed down breathing to prevent hyperventilation and pursed lip breathing.  Plan:   Relaxation and pursed lip breathing.  Loratadine (Claritin) 10mg  once daily as needed drainge.  Saline nasal spray /gel as needed for nasal congestion .   Follow up with [name] as scheduled and as needed  Please contact office for sooner follow up if symptoms do not improve or worsen   Medications Added to Medication List This Visit: 1)  Seroquel 200 Mg Tabs (Quetiapine fumarate) .... Take 1 tab by mouth at bedtime 2)  Nuedexta 20-10 Mg Caps (Dextromethorphan-quinidine) .... Take 1 capsule by mouth once a day 3)  Calcium Carbonate-vitamin D 600-400 Mg-unit Tabs (Calcium carbonate-vitamin d) .... Take 1 tablet by mouth two times a day  Complete Medication List: 1)  Aspir-low 81 Mg Tbec (Aspirin) .Marland Kitchen.. 1 once daily pc 2)  Coreg 6.25 Mg Tabs (Carvedilol) .Marland Kitchen.. 1 by mouth two times a day 3)  Alprazolam 1 Mg Tabs (Alprazolam) .... 1/2  by mouth four times a day as needed for anxiety 4)  Lamotrigine 200 Mg Tabs (Lamotrigine) .... Take one tablet by mouth once daily in the am 5)  Omeprazole 40 Mg Cpdr (Omeprazole) .... Once daily 6)  Seroquel 200 Mg Tabs (Quetiapine fumarate) .... Take 1 tab by mouth at bedtime 7)  Carbidopa-levodopa 10-100 Mg Tabs (Carbidopa-levodopa) .Marland Kitchen.. 1 1/2 by mouth three times a day 8)  Loratadine 10 Mg Tabs (Loratadine) .Marland Kitchen.. 1 by mouth once daily 9)  Fluticasone Propionate 50 Mcg/act Susp (Fluticasone propionate) .... 2 sprays each nostril once daily 10)  Diovan 160 Mg Tabs (Valsartan) .Marland Kitchen.. 1 two times a day 11)  Hydrochlorothiazide 25 Mg Tabs (Hydrochlorothiazide) .Marland Kitchen.. 1 once daily 12)  Nuedexta 20-10  Mg Caps (Dextromethorphan-quinidine) .... Take 1 capsule by mouth once a day 13)  Aleve 220 Mg Tabs (Naproxen sodium) .... 2 tabs two times a day as needed pain 14)  Calcium Carbonate-vitamin D 600-400 Mg-unit Tabs (Calcium carbonate-vitamin d) .... Take 1 tablet by mouth two times a day 15)  Pre-natal Formula Tabs (Prenatal multivit-min-fe-fa) .... Once daily 16)  Travatan Z 0.004 % Soln (Travoprost) .... Once daily 17)  Proventil Hfa 108 (90 Base) Mcg/act Aers (Albuterol sulfate) .... 2 puffs q4h as needed for sob  Other Orders: Est. Patient Level III (16109)  Patient Instructions: 1)   Relaxation and pursed lip breathing.  2)  Loratadine (Claritin) 10mg  once daily as needed drainge.  3)  Saline nasal spray /gel as needed for nasal congestion .  4)   Follow up with [name] as scheduled and as needed  5)  Please contact office for sooner follow up if symptoms do not improve or worsen

## 2010-07-26 NOTE — Progress Notes (Signed)
Summary: Pt daughter called req status of form for Elder Care  Phone Note Call from Patient Call back at Work Phone [phone]   Caller: daughter - Eunice Blase Summary of Call: Pts daughter called to check on status of form for pt re: Elder Care. Pls call re: status and if this form is ready for pick up. Form was dropped off on Friday 07/13/10 mid afternoon.  Initial call taken by: Lucy Antigua,  July 17, 2010 2:09 PM  Follow-up for Phone Call        per dr fry he took care of this week.  Follow-up by: Pura Spice, RN,  July 17, 2010 4:52 PM  Additional Follow-up for Phone Call Additional follow up Details #1::        daughter needs form by friday Additional Follow-up by: Heron Sabins,  July 18, 2010 2:33 PM    Additional Follow-up for Phone Call Additional follow up Details #2::    please tell the daughter that we are working on getting this ready ASAP  Follow-up by: Nelwyn Salisbury MD,  July 18, 2010 4:21 PM  Additional Follow-up for Phone Call Additional follow up Details #3:: Details for Additional Follow-up Action Taken: debbie daughter aware Additional Follow-up by: Pura Spice, RN,  July 18, 2010 4:59 PM   Appended Document: Pt daughter called req status of form for Elder Care daughter notified to pick up form today from [place].

## 2010-07-26 NOTE — Letter (Signed)
Summary: Medical Examination Report  Medical Examination Report   Imported By: Maryln Gottron 07/19/2010 14:23:04  _____________________________________________________________________  External Attachment:    Type:   Image     Comment:   External Document

## 2010-07-26 NOTE — Assessment & Plan Note (Signed)
Summary: ST // RS   Vital Signs:  Patient profile:   65 year old female Weight:      148 pounds BMI:     24.72 O2 Sat:      97 % on Room air Temp:     97.6 degrees F Pulse rate:   78 / minute BP sitting:   98 / 54  (left arm)  Vitals Entered By: Kyung Rudd, CMA (July 19, 2010 11:07 AM)  O2 Flow:  Room air CC: pt c/o ST and cough   Primary Care Provider:  Nelwyn Salisbury MD  CC:  pt c/o ST and cough.  History of Present Illness:  patient presents to clinic as a work in for evaluation of sore throat. notes a two-day history of sore throat productive cough without fever chills shortness of breath or wheezing. no known sick exposures. No alleviating or exacerbating factors. Has not attempted any medication for symptoms.  Current Medications (verified): 1)  Aspir-Low 81 Mg Tbec (Aspirin) .Marland Kitchen.. 1 Once Daily Pc 2)  Coreg 6.25 Mg  Tabs (Carvedilol) .Marland Kitchen.. 1 By Mouth Two Times A Day 3)  Alprazolam 1 Mg  Tabs (Alprazolam) .... 1/2  By Mouth Four Times A Day As Needed For Anxiety 4)  Lamotrigine 200 Mg Tabs (Lamotrigine) .... Take One Tablet By Mouth Once Daily in The Am 5)  Omeprazole 40 Mg Cpdr (Omeprazole) .... Once Daily 6)  Seroquel 200 Mg Tabs (Quetiapine Fumarate) .... Take 1 Tab By Mouth At Bedtime 7)  Carbidopa-Levodopa 10-100 Mg Tabs (Carbidopa-Levodopa) .Marland Kitchen.. 1 1/2 By Mouth Three Times A Day 8)  Loratadine 10 Mg Tabs (Loratadine) .Marland Kitchen.. 1 By Mouth Once Daily 9)  Fluticasone Propionate 50 Mcg/act Susp (Fluticasone Propionate) .... 2 Sprays Each Nostril Once Daily 10)  Diovan 160 Mg Tabs (Valsartan) .Marland Kitchen.. 1 Two Times A Day 11)  Hydrochlorothiazide 25 Mg Tabs (Hydrochlorothiazide) .Marland Kitchen.. 1 Once Daily 12)  Aleve 220 Mg Tabs (Naproxen Sodium) .... 2 Tabs Two Times A Day As Needed Pain 13)  Calcium Carbonate-Vitamin D 600-400 Mg-Unit  Tabs (Calcium Carbonate-Vitamin D) .... Take 1 Tablet By Mouth Two Times A Day 14)  Pre-Natal Formula  Tabs (Prenatal Multivit-Min-Fe-Fa) .... Once  Daily 15)  Travatan Z 0.004 % Soln (Travoprost) .... Once Daily 16)  Proventil Hfa 108 (90 Base) Mcg/act Aers (Albuterol Sulfate) .... 2 Puffs Q4h As Needed For Sob 17)  Seroquel 25 Mg Tabs (Quetiapine Fumarate) .Marland Kitchen.. 1 in The Morning 18)  Seroquel 200 Mg Tabs (Quetiapine Fumarate) .Marland Kitchen.. 1 By Mouth At Night  Allergies (verified): 1)  ! * Aricept 2)  ! * Tramadol  Past History:  Past medical, surgical, family and social histories (including risk factors) reviewed for relevance to current acute and chronic problems.  Past Medical History: Reviewed history from 12/08/2009 and no changes required. 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 EF previously 25% Echo 1/10: EF 40%. moderate MR.     b. Coronaries ok by cath 11/08 2) Moderate to severe mitral regurgitation 3) depression, sees Dr. Emerson Monte 4) Asthma/allergies followed by Dr. Delton Coombes 5) h/o nephrolithiasis 6) HTN 7) Hyperlipidemia 8) GERD 9) knee pain 10) anemia dementia GLAUCOMA (ICD-365.9) OSTEOARTHRITIS (ICD-715.90), sees Dr. Darlina Sicilian (Orthopedics) in [facility] HYPOTHYROIDISM (ICD-244.9) COLONIC POLYPS, HX OF (ICD-V12.72) Parkinsons disease, sees Dr. Louanna Raw  at [facility]  Past Surgical History: Reviewed history from 12/08/2009 and no changes required. Cholecystectomy Tonsillectomy cardiac catheterization 11-08, normal coronaries Colonoscopy 03-2007, AVM's per Dr. Elnoria Howard right  knee arthroscopy Synvisc injections to the knees  Family History: Reviewed history from 05/12/2007 and no changes required. Family History of Arthritis Family History Breast cancer 1st degree relative <50 Family History Diabetes 1st degree relative Family History High cholesterol Family History of Stroke M 1st degree relative <50  Social History: Reviewed history from 06/04/2010 and no changes required. Divorced Never Smoked Alcohol use-no Drug use-no Regular exercise-no pt lives with sister, Santa Abdelrahman - pt has  dementia  Review of Systems General:  Denies chills, fever, and sweats. Eyes:  Denies discharge, eye irritation, and red eye. ENT:  Complains of sore throat; denies ear discharge and earache. Resp:  Complains of cough and sputum productive; denies coughing up blood, shortness of breath, and wheezing. Derm:  Denies rash.  Physical Exam  General:  Well-developed,well-nourished,in no acute distress; alert,appropriate and cooperative throughout examination Head:  Normocephalic and atraumatic without obvious abnormalities. No apparent alopecia or balding. Eyes:  pupils equal, pupils round, corneas and lenses clear, and no injection.   Ears:  External ear exam shows no significant lesions or deformities.  Otoscopic examination reveals clear canals, tympanic membranes are intact bilaterally without bulging, retraction, inflammation or discharge. Hearing is grossly normal bilaterally. Nose:  External nasal examination shows no deformity or inflammation. Nasal mucosa are pink and moist without lesions or exudates. Mouth:   mild to moderate posterior erythema without exudate.no posterior lymphoid hypertrophy, no postnasal drip, no lesions, and no aphthous ulcers.   Neck:  No deformities, masses, or tenderness noted. Lungs:  Normal respiratory effort, chest expands symmetrically. Lungs are clear to auscultation, no crackles or wheezes. Skin:  turgor normal, color normal, and no rashes.     Impression & Recommendations:  Problem # 1:  SORE THROAT (ICD-462) Assessment New  rapid strep negative. treat symptoms of cough with Tessalon Perles when necessary. consider  antibiotic therapy if no improvement of symptoms after proximally 7 to 10 days. Her updated medication list for this problem includes:    Aspir-low 81 Mg Tbec (Aspirin) .Marland Kitchen... 1 once daily pc    Aleve 220 Mg Tabs (Naproxen sodium) .Marland Kitchen... 2 tabs two times a day as needed pain  Orders: Rapid Strep (16109)  Complete Medication List: 1)   Aspir-low 81 Mg Tbec (Aspirin) .Marland Kitchen.. 1 once daily pc 2)  Coreg 6.25 Mg Tabs (Carvedilol) .Marland Kitchen.. 1 by mouth two times a day 3)  Alprazolam 1 Mg Tabs (Alprazolam) .... 1/2  by mouth four times a day as needed for anxiety 4)  Lamotrigine 200 Mg Tabs (Lamotrigine) .... Take one tablet by mouth once daily in the am 5)  Omeprazole 40 Mg Cpdr (Omeprazole) .... Once daily 6)  Seroquel 200 Mg Tabs (Quetiapine fumarate) .... Take 1 tab by mouth at bedtime 7)  Carbidopa-levodopa 10-100 Mg Tabs (Carbidopa-levodopa) .Marland Kitchen.. 1 1/2 by mouth three times a day 8)  Loratadine 10 Mg Tabs (Loratadine) .Marland Kitchen.. 1 by mouth once daily 9)  Fluticasone Propionate 50 Mcg/act Susp (Fluticasone propionate) .... 2 sprays each nostril once daily 10)  Diovan 160 Mg Tabs (Valsartan) .Marland Kitchen.. 1 two times a day 11)  Hydrochlorothiazide 25 Mg Tabs (Hydrochlorothiazide) .Marland Kitchen.. 1 once daily 12)  Aleve 220 Mg Tabs (Naproxen sodium) .... 2 tabs two times a day as needed pain 13)  Calcium Carbonate-vitamin D 600-400 Mg-unit Tabs (Calcium carbonate-vitamin d) .... Take 1 tablet by mouth two times a day 14)  Pre-natal Formula Tabs (Prenatal multivit-min-fe-fa) .... Once daily 15)  Travatan Z 0.004 % Soln (Travoprost) .... Once  daily 16)  Proventil Hfa 108 (90 Base) Mcg/act Aers (Albuterol sulfate) .... 2 puffs q4h as needed for sob 17)  Seroquel 25 Mg Tabs (Quetiapine fumarate) .Marland Kitchen.. 1 in the morning 18)  Seroquel 200 Mg Tabs (Quetiapine fumarate) .Marland Kitchen.. 1 by mouth at night 19)  Tessalon Perles 100 Mg Caps (Benzonatate) .... One by mouth three times a day as needed cough  Patient Instructions: 1)  Get plenty of rest, drink lots of clear liquids, and use Tylenol or Ibuprofen for fever and comfort. Return in 7-10 days if you're not better:sooner if you're feeling worse. Prescriptions: TESSALON PERLES 100 MG CAPS (BENZONATATE) one by mouth three times a day as needed cough  #20 x 0   Entered and Authorized by:   Edwyna Perfect MD   Signed by:   Edwyna Perfect MD on 07/19/2010   Method used:   Electronically to        [facility]  [address] # [id number]* (retail)       [address]       [place], [place]  [address]       Ph: [phone] or [phone]       Fax: [phone]   RxID:   [id number]    Orders Added: 1)  Rapid Strep [73220] 2)  Est. Patient Level III [25427]    Laboratory Results    Other Tests  Rapid Strep: negative

## 2010-08-01 ENCOUNTER — Telehealth: Payer: Self-pay | Admitting: *Deleted

## 2010-08-01 NOTE — Telephone Encounter (Signed)
See below

## 2010-08-01 NOTE — Telephone Encounter (Signed)
I don't know of a single agency to talk to, but I would start by Googling in [place] nursing homes. This would get her started to find info. Round the clock nursing is very expensive and hard to find

## 2010-08-01 NOTE — Telephone Encounter (Signed)
Notified daughter

## 2010-08-01 NOTE — Telephone Encounter (Signed)
Daughter and Celine Ahr would like to know the steps to take to get either around the clock nursing or somewhere the pt can live with the diagnosis of dementia.

## 2010-08-03 ENCOUNTER — Ambulatory Visit (INDEPENDENT_AMBULATORY_CARE_PROVIDER_SITE_OTHER): Payer: 59 | Admitting: Family Medicine

## 2010-08-03 DIAGNOSIS — Z0000 Encounter for general adult medical examination without abnormal findings: Secondary | ICD-10-CM

## 2010-08-06 LAB — TB SKIN TEST: TB Skin Test: NEGATIVE mm

## 2010-08-06 NOTE — Progress Notes (Signed)
Addended by: Madison Hickman on: 08/06/2010 04:30 PM   Modules accepted: Orders

## 2010-08-17 ENCOUNTER — Telehealth: Payer: Self-pay | Admitting: Family Medicine

## 2010-08-17 NOTE — Telephone Encounter (Signed)
COPY AT FRONT DESK. PT NOTIFIED BY RHONDA S.

## 2010-08-17 NOTE — Telephone Encounter (Signed)
Pts daughter req to get a copy of pts tb test results.  Will pick this up on Monday 08/20/10.  Pls call

## 2010-08-21 ENCOUNTER — Telehealth: Payer: Self-pay | Admitting: *Deleted

## 2010-08-21 NOTE — Telephone Encounter (Signed)
[facility] will be calling for a med list today.  Please call daughter if there are any questions or issues.

## 2010-08-29 ENCOUNTER — Telehealth: Payer: Self-pay | Admitting: Family Medicine

## 2010-08-29 NOTE — Telephone Encounter (Signed)
NO EMERGENCY..... [facility] CALLED TO DISCUSS PT... WOULD LIKE A RETURN CALL TO [phone].

## 2010-08-30 ENCOUNTER — Emergency Department (INDEPENDENT_AMBULATORY_CARE_PROVIDER_SITE_OTHER): Payer: Medicare PPO

## 2010-08-30 ENCOUNTER — Emergency Department (HOSPITAL_BASED_OUTPATIENT_CLINIC_OR_DEPARTMENT_OTHER)
Admission: EM | Admit: 2010-08-30 | Discharge: 2010-08-30 | Disposition: A | Discharge status: Home / Self Care | Payer: Medicare PPO | Attending: Emergency Medicine | Admitting: Emergency Medicine

## 2010-08-30 DIAGNOSIS — F22 Delusional disorders: Secondary | ICD-10-CM

## 2010-08-30 DIAGNOSIS — I509 Heart failure, unspecified: Secondary | ICD-10-CM | POA: Insufficient documentation

## 2010-08-30 DIAGNOSIS — N390 Urinary tract infection, site not specified: Secondary | ICD-10-CM | POA: Insufficient documentation

## 2010-08-30 DIAGNOSIS — Z79899 Other long term (current) drug therapy: Secondary | ICD-10-CM | POA: Insufficient documentation

## 2010-08-30 DIAGNOSIS — F0280 Dementia in other diseases classified elsewhere without behavioral disturbance: Secondary | ICD-10-CM | POA: Insufficient documentation

## 2010-08-30 DIAGNOSIS — Z0289 Encounter for other administrative examinations: Secondary | ICD-10-CM

## 2010-08-30 DIAGNOSIS — G309 Alzheimer's disease, unspecified: Secondary | ICD-10-CM | POA: Insufficient documentation

## 2010-08-30 DIAGNOSIS — E7800 Pure hypercholesterolemia, unspecified: Secondary | ICD-10-CM | POA: Insufficient documentation

## 2010-08-30 LAB — URINALYSIS, ROUTINE W REFLEX MICROSCOPIC
Bilirubin Urine: NEGATIVE
Glucose, UA: NEGATIVE mg/dL
Ketones, ur: NEGATIVE mg/dL
Nitrite: NEGATIVE
Protein, ur: NEGATIVE mg/dL
Specific Gravity, Urine: 1.012 (ref 1.005–1.030)
Urobilinogen, UA: 1 mg/dL (ref 0.0–1.0)
pH: 5.5 (ref 5.0–8.0)

## 2010-08-30 LAB — DIFFERENTIAL
Basophils Absolute: 0 10*3/uL (ref 0.0–0.1)
Basophils Relative: 0 % (ref 0–1)
Eosinophils Absolute: 0.5 10*3/uL (ref 0.0–0.7)
Eosinophils Relative: 8 % — ABNORMAL HIGH (ref 0–5)
Lymphocytes Relative: 8 % — ABNORMAL LOW (ref 12–46)
Lymphs Abs: 0.5 10*3/uL — ABNORMAL LOW (ref 0.7–4.0)
Monocytes Absolute: 0.5 10*3/uL (ref 0.1–1.0)
Monocytes Relative: 8 % (ref 3–12)
Neutro Abs: 4.7 10*3/uL (ref 1.7–7.7)
Neutrophils Relative %: 76 % (ref 43–77)

## 2010-08-30 LAB — BASIC METABOLIC PANEL
CO2: 25 mEq/L (ref 19–32)
Chloride: 106 mEq/L (ref 96–112)
GFR calc Af Amer: 30 mL/min — ABNORMAL LOW (ref >60)
Glucose, Bld: 122 mg/dL — ABNORMAL HIGH (ref 70–99)
Sodium: 146 mEq/L — ABNORMAL HIGH (ref 135–145)

## 2010-08-30 LAB — URINE MICROSCOPIC-ADD ON

## 2010-08-30 LAB — CBC
HCT: 34 % — ABNORMAL LOW (ref 36.0–46.0)
Hemoglobin: 11.5 g/dL — ABNORMAL LOW (ref 12.0–15.0)
MCHC: 33.8 g/dL (ref 30.0–36.0)
MCV: 89.9 fL (ref 78.0–100.0)
WBC: 6.2 10*3/uL (ref 4.0–10.5)

## 2010-08-30 NOTE — Telephone Encounter (Signed)
Left message

## 2010-08-30 NOTE — Telephone Encounter (Signed)
I don't have time for a phone conversation. Ask her to send me a fax or something about her concerns

## 2010-08-31 LAB — URINE CULTURE

## 2010-09-12 LAB — COMPREHENSIVE METABOLIC PANEL
AST: 48 U/L — ABNORMAL HIGH (ref 0–37)
BUN: 32 mg/dL — ABNORMAL HIGH (ref 6–23)
CO2: 25 mEq/L (ref 19–32)
Chloride: 105 mEq/L (ref 96–112)
Creatinine, Ser: 1.47 mg/dL — ABNORMAL HIGH (ref 0.4–1.2)
GFR calc Af Amer: 43 mL/min — ABNORMAL LOW (ref >60)
GFR calc non Af Amer: 36 mL/min — ABNORMAL LOW (ref >60)
Glucose, Bld: 127 mg/dL — ABNORMAL HIGH (ref 70–99)
Total Bilirubin: 0.7 mg/dL (ref 0.3–1.2)

## 2010-09-12 LAB — BASIC METABOLIC PANEL
BUN: 10 mg/dL (ref 6–23)
BUN: 12 mg/dL (ref 6–23)
CO2: 24 mEq/L (ref 19–32)
CO2: 24 mEq/L (ref 19–32)
CO2: 25 mEq/L (ref 19–32)
Calcium: 8.9 mg/dL (ref 8.4–10.5)
Calcium: 8.9 mg/dL (ref 8.4–10.5)
Calcium: 9.4 mg/dL (ref 8.4–10.5)
Creatinine, Ser: 0.83 mg/dL (ref 0.4–1.2)
Creatinine, Ser: 0.84 mg/dL (ref 0.4–1.2)
Creatinine, Ser: 0.94 mg/dL (ref 0.4–1.2)
GFR calc Af Amer: >60 mL/min (ref >60)
GFR calc Af Amer: >60 mL/min (ref >60)
GFR calc Af Amer: >60 mL/min (ref >60)
GFR calc non Af Amer: >60 mL/min (ref >60)
Glucose, Bld: 124 mg/dL — ABNORMAL HIGH (ref 70–99)
Glucose, Bld: 95 mg/dL (ref 70–99)
Potassium: 3.5 mEq/L (ref 3.5–5.1)
Sodium: 144 mEq/L (ref 135–145)

## 2010-09-12 LAB — CBC
HCT: 31.2 % — ABNORMAL LOW (ref 36.0–46.0)
HCT: 33.5 % — ABNORMAL LOW (ref 36.0–46.0)
Hemoglobin: 11.5 g/dL — ABNORMAL LOW (ref 12.0–15.0)
MCHC: 34 g/dL (ref 30.0–36.0)
MCHC: 34.3 g/dL (ref 30.0–36.0)
MCHC: 34.4 g/dL (ref 30.0–36.0)
MCV: 89.6 fL (ref 78.0–100.0)
Platelets: 114 10*3/uL — ABNORMAL LOW (ref 150–400)
Platelets: 136 10*3/uL — ABNORMAL LOW (ref 150–400)
Platelets: 138 10*3/uL — ABNORMAL LOW (ref 150–400)
RBC: 3.36 MIL/uL — ABNORMAL LOW (ref 3.87–5.11)
RBC: 3.74 MIL/uL — ABNORMAL LOW (ref 3.87–5.11)
RDW: 14.2 % (ref 11.5–15.5)
RDW: 14.6 % (ref 11.5–15.5)
WBC: 3.7 10*3/uL — ABNORMAL LOW (ref 4.0–10.5)
WBC: 4.5 10*3/uL (ref 4.0–10.5)
WBC: 4.9 10*3/uL (ref 4.0–10.5)

## 2010-09-12 LAB — CLOSTRIDIUM DIFFICILE EIA

## 2010-09-12 LAB — TSH: TSH: 2.138 u[IU]/mL (ref 0.350–4.500)

## 2010-09-12 LAB — DIFFERENTIAL
Basophils Absolute: 0 10*3/uL (ref 0.0–0.1)
Eosinophils Relative: 10 % — ABNORMAL HIGH (ref 0–5)
Lymphocytes Relative: 25 % (ref 12–46)

## 2010-09-12 LAB — URINALYSIS, ROUTINE W REFLEX MICROSCOPIC
Bilirubin Urine: NEGATIVE
Glucose, UA: NEGATIVE mg/dL
Hgb urine dipstick: NEGATIVE
Ketones, ur: NEGATIVE mg/dL
Protein, ur: NEGATIVE mg/dL

## 2010-09-12 LAB — POCT CARDIAC MARKERS

## 2010-09-12 LAB — CARDIAC PANEL(CRET KIN+CKTOT+MB+TROPI)
CK, MB: 3 ng/mL (ref 0.3–4.0)
Total CK: 255 U/L — ABNORMAL HIGH (ref 7–177)

## 2010-09-12 LAB — GLUCOSE, CAPILLARY
Glucose-Capillary: 107 mg/dL — ABNORMAL HIGH (ref 70–99)
Glucose-Capillary: 84 mg/dL (ref 70–99)

## 2010-09-12 LAB — URINE CULTURE: Colony Count: NO GROWTH

## 2010-09-12 LAB — TROPONIN I: Troponin I: 0.03 ng/mL (ref 0.00–0.06)

## 2010-09-12 LAB — PROTIME-INR: Prothrombin Time: 15 seconds (ref 11.6–15.2)

## 2010-09-14 DIAGNOSIS — Z0279 Encounter for issue of other medical certificate: Secondary | ICD-10-CM

## 2010-10-12 ENCOUNTER — Telehealth: Payer: Self-pay | Admitting: *Deleted

## 2010-10-12 MED ORDER — LOPERAMIDE HCL 2 MG PO CAPS: 2.0000 mg | ORAL_CAPSULE | Freq: Four times a day (QID) | ORAL | Status: AC | PRN

## 2010-10-12 NOTE — Telephone Encounter (Signed)
Lorraine [name] of [facility]   Resident has had loose stools and needs written order for Immodium faxed to  [phone]

## 2010-10-12 NOTE — Telephone Encounter (Signed)
rx faxed to [facility].

## 2010-11-05 ENCOUNTER — Encounter: Payer: Self-pay | Admitting: Internal Medicine

## 2010-11-06 ENCOUNTER — Ambulatory Visit (INDEPENDENT_AMBULATORY_CARE_PROVIDER_SITE_OTHER): Payer: 59 | Admitting: Internal Medicine

## 2010-11-06 ENCOUNTER — Encounter: Payer: Self-pay | Admitting: Internal Medicine

## 2010-11-06 VITALS — BP 120/70 | HR 70 | Ht 65.0 in | Wt 160.0 lb

## 2010-11-06 DIAGNOSIS — I080 Rheumatic disorders of both mitral and aortic valves: Secondary | ICD-10-CM

## 2010-11-06 DIAGNOSIS — I509 Heart failure, unspecified: Secondary | ICD-10-CM

## 2010-11-06 NOTE — Assessment & Plan Note (Signed)
[facility]                            CARDIOLOGY OFFICE NOTE   Lorraine [name], Lorraine [name]                    MRN:          [id number]  DATE:10/01/2007                            DOB:          [birth year]    INTERVAL HISTORY:  Lorraine [name] is a very pleasant 65 year old with a  history of gastroesophageal reflux disease, asthma, hyperlipidemia,  attention deficit disorder and iron-deficiency anemia thought secondary  to AVMs.  She was admitted to the hospital at the end of last year with  acute heart failure symptoms.  Echo showed an EF of about 30%.  She  underwent catheterization which showed normal coronary arteries with  severe mitral regurgitation.  TEE confirmed mitral regurgitation with a  restricted posterior leaflet of the mitral valve.  When she followed up  with [place] in December, she had a repeat echocardiogram which showed a  normalized ejection fraction and only mild MR.   She returns today for routine followup.  Overall, she is doing okay.  She does have episodes of shortness of breath and wheezing which she  thinks are related to allergies and her asthma.  She does have an  inhaler.  She has not had any chest pain or lower extremity edema.  No  PND.   CURRENT MEDICATIONS:  1. Travatan.  2. Diovan/HCTZ 160/25.  3. Zoloft 200 a day.  4. Protonix 40 a day.  5. Aspirin 81 a day.  6. Iron 324 b.i.d.  7. Coreg 6.25 b.i.d.  8. Cymbalta 60 a day.   PHYSICAL EXAMINATION:  GENERAL:  She is well-appearing in no acute  distress.  She ambulates around the clinic without any respiratory  difficulty.  VITAL SIGNS:  Blood pressure is 132/60, heart rate 73, weight is 142, it  is up 4 pounds from previous.  HEENT:  Normal.  NECK:  Supple.  No JVD.  Carotids are 2+ bilaterally without bruits.  There is no lymphadenopathy or thyromegaly.  CARDIAC:  PMI is nondisplaced.  Irregular rate and rhythm with no  murmurs.  I do not hear any MR.  There is no  S3.  LUNGS:  Clear with no wheezing.  ABDOMEN:  Soft, nontender, nondistended.  No hepatosplenomegaly, no  bruits, no masses.  Good bowel sounds.  EXTREMITIES:  Warm with no cyanosis, clubbing or edema.  No rash.  NEURO:  Alert and oriented x3.  Cranial nerves II-XII are intact.  Moves  all 4 extremities without difficulty.  Affect is pleasant.   ASSESSMENT/PLAN:  Transient left ventricular systolic dysfunction with  congestive heart failure, it seems to have resolved.  Her mitral  regurgitation is also better.  I suspect this may have been due to viral  cardiomyopathy.  However, she is having some dyspnea.  She does have  some diastolic dysfunction on her echocardiogram, but I do not see any  evidence of fluid overload.  We will ask her to go back to pulmonary to  see if this could be part of her reactive airway disease.  I will see  her back in a couple of months with an echocardiogram and  followup.  She  knows to contact me if her symptoms progress at all.     Bevelyn Buckles. Bensimhon, MD  Electronically Signed    DRB/MedQ  DD: 10/01/2007  DT: 10/01/2007  Job #: [id number]

## 2010-11-06 NOTE — H&P (Signed)
NAME:  Lorraine [name], Lorraine [name]             ACCOUNT NO.:  [id number]   MEDICAL RECORD NO.:  [id number]          PATIENT TYPE:  INP   LOCATION:  1405                         FACILITY:  [facility]   PHYSICIAN:  Charlaine Dalton. [name], MD, FCCPDATE OF BIRTH:  04-16-1946   DATE OF ADMISSION:  05/01/2007  DATE OF DISCHARGE:                              HISTORY & PHYSICAL   CHIEF COMPLAINT:  Dyspnea and fatigue.   HISTORY:  A 65 year old white female never-smoker with evidence of  relatively mild asthma with a prominent vocal cord dysfunction  clinically, who was seen with new-onset dyspnea with worsening dyspnea  in September 2008 which was found to be due to severe anemia with iron  deficiency.  She was placed on iron and evaluated by Dr. Elnoria Howard and tells  me today that there was no source for GI bleeding identified.   However, she came to the office for regular followup today stating she  had increasing dyspnea over the last 10 days associated with fatigue and  a sensation of palpitations.  She has had no chest pain or overt GI  bleeding.  She has minimal dry cough with intermittent swelling of her  ankles but no orthopnea, PND, fevers, chills or sweats, or change in  sputum.   PAST HISTORY:  Significant for:  1. Hyperlipidemia.  2. Childhood asthma.  3. Remote cholecystectomy.  4. Arthritis in her knees.   ALLERGIES:  None known.   MEDICATIONS:  She is supposed to be using a medication calendar that was  just generated for her in the office on October 3 but has not been able  to keep up with it.  Specifically, she is supposed to be on:  1. Qvar 40 two puffs b.i.d.  2. Strattera 80 mg one daily.  3. Protonix 40 mg b.i.d.  4. Metoclopramide 10 mg q.i.d.  5. Travatan eyedrops one OU daily.  6. Diovan HCT 160/25 one daily.  7. Ferrex 150 one daily.  8. Calcium with D one b.i.d.  9. Zoloft 100 mg two daily.   She has p.r.n. ProAir, Delsym, tramadol 50 and alprazolam 0.25 to use on  a p.r.n.  basis.   SOCIAL HISTORY:  She has never smoked.  She works in Audiological scientist.  She  has no unusual travel, pet or hobby exposure.   FAMILY HISTORY:  Positive for emphysema only in smokers.  Lung cancer  also her mother who was a smoker.   REVIEW OF SYSTEMS:  Taken in detail and negative except as outlined  above.   PHYSICAL EXAMINATION:  This is an anxious white female who has a hard  time focusing on one issue at a time, stating she just can't keep up  with her medicines anymore.  She is dependent on her sister for this.  She is afebrile with normal vital signs except for a pulse rate of  around 130 at rest and with otherwise normal vital signs.  HEENT:  Unremarkable.  Oropharynx is clear.  NECK:  Supple without cervical adenopathy or tenderness.  Trachea is  midline, no thyromegaly.  LUNG FIELDS:  Reveal prominent pseudowheeze but  the peripheral lung  fields are perfectly clear.  There is a regular rhythm without murmur, gallop, rub.  There was a 1/6  systolic ejection murmur.  ABDOMEN:  Soft, benign with no palpable organomegaly, mass or  tenderness.  EXTREMITIES:  Warm without calf tenderness, cyanosis, clubbing or edema.  NEUROLOGIC:  No focal deficits or pathologic reflexes although she  appeared both anxious and depressed.  SKIN EXAM:  Unremarkable except she did look a bit pale.   EKG revealed sinus tachycardia with PAC versus atrial fibrillation at a  rate of 130.  Chest x-ray and labs are pending.  Her most recent studies  in the office from September 2008 show a normal TSH  but a BNP of 678  obtained while she was anemic, with a hematocrit of 27% and an MCV of 79   IMPRESSION:  1. Increasing dyspnea over the last 10 days associated with marked      increase in heart rate suggestive of either sinus tachycardia      (which may be physiologic and related to anemia), or atrial      fibrillation which also may be triggered by anemia.  In either      case, she needs to be  admitted to sort through this.  She does have      a recent GI workup that was felt to be negative by Dr. Elnoria Howard but is      requesting a second opinion by Dr. Lina Sar so we may need to      ask the GI service to see her at some point if she has a persistent      iron-deficiency anemia despite taking Ferrex.  2. Vocal cord dysfunction with a history suggesting also anxiety      depression contributing to her dyspnea.  She now appears not      capable of taking care of her own affairs for reasons that are not      clear and a psychiatric or neurologic evaluation may be necessary.      Hopefully this is just due to profound anemia and will be      correctable during her hospitalization.      Charlaine Dalton. [name], MD, [facility]  Electronically Signed     MBW/MEDQ  D:  05/01/2007  T:  05/02/2007  Job:  [id number]   cc:   Gabriel Earing, M.D.  Fax: [phone]

## 2010-11-06 NOTE — Assessment & Plan Note (Signed)
[facility]                             PULMONARY OFFICE NOTE   Lorraine [name], Lorraine [name]                    MRN:          [id number]  DATE:03/20/2007                            DOB:          19-Jul-1945    HISTORY:  This is a 65 year old white female just seen on 9/18 for an  evaluation of dyspnea with atypical features. Lab results revealed a  hematocrit of 27% and she was treated with the resumption of treatments  directed at reflux and asked to continue Qvar 40 2 puffs b.i.d.   She comes back today stating that she is no better and did not respond  even to prednisone. She describes both fatigue and dyspnea that occur  when she tries to walk across the lobby at work and does not think that  she can work anymore.   She denies any pleuritic or exertional chest pain. She denies orthopnea,  PND, leg swelling, overt reflux symptoms, or sinus complaints.   She has noticed slight leg swelling since her previous visit.   PHYSICAL EXAMINATION:  GENERAL:  She is a pleasant ambulatory white  female in no acute distress.  VITAL SIGNS:  Afebrile and stable.  HEENT:  Unremarkable. Oropharynx clear.  LUNGS:  Fields are clear bilaterally with minimum pseudo wheeze.  CARDIAC:  Regular rate and rhythm with no murmurs, rubs, or gallops.  ABDOMEN:  Soft and benign.  EXTREMITIES:  Warm without calf tenderness, cyanosis or clubbing. She  does have 1+ pitting edema.   PFTs were performed today and reveal an FEV1 of 1.39 with a ratio of 65%  with marked inspiratory truncation.   Chest x-ray was reviewed from her previous visit dated 9/18 showing mild  increased markings with very mild cardiomegaly.   LABORATORY DATA:  Hematocrit was 27% on 03/12/2007 with an MCV of 39 and  MCHC was still within normal limits at 32 (ruling against iron  deficiency), and bicarb level was 29, TSH was normal. BNP was 678.   IMPRESSION:  1. This patient does have evidence of chronic  asthma with an      irreversible component and she has never smoked cigarettes.      However, the predominant finding is one of inspiratory truncation      that correlates with the pseudo wheeze that she has on exam, which      to me strongly favors pseudo asthma from either anxiety or reflux.      I recommended therefore that she continue on the above reflux      regimen perfectly regularly until she returns.  2. I note that she is having increasing leg swelling and did have a      moderate elevation of BNP that may relate to either hypertension or      anemia. I have added Diovan 160/25 1 daily to her regimen.  3. She clearly has an anemia and is probably iron deficient. She has      no overt bleeding. I have recommended New Iron 150 1 daily with      return in one week after obtaining  baseline iron levels today, and      if in fact she is iron deficient would consider a GI workup when      she returns.     Charlaine Dalton. [name], MD, [facility]  Electronically Signed    MBW/MedQ  DD: 03/20/2007  DT: 03/21/2007  Job #: [id number]   cc:   Gabriel Earing, M.D.

## 2010-11-06 NOTE — Assessment & Plan Note (Addendum)
Relatively stable. Does have mild LE edema. I suggested switching HCTZ to lasix 20 qd (with 20 kcl) but she is worried about having to run to the bathroom a lot. Will try compression stockings first and see if these help manage her edema. Continue cardvedilol and valsartan. Will not increase b-block due to risk of worsening Parkinsonism.

## 2010-11-06 NOTE — Assessment & Plan Note (Signed)
[facility]                             PULMONARY OFFICE NOTE   Lorraine, [name]                    MRN:          [id number]  DATE:03/12/2007                            DOB:          Jun 09, 1946    HISTORY:  This is a 65 year old white female, never smoker, last seen  here on April 1 with suspected asthma with a large upper airway  component which was felt to be possibly due to GERD. However, using a  reverse therapeutic trial approach, I asked her to stop all of her  GERD medicines over a period of several weeks and interestingly she did  not have any flare up of her symptoms until a week ago. Since that time  she has had increasing dry cough associated with dyspnea, and has  resorted to using her ProAir again, which she had not needed previously.  She denies any sputum production, chest pain, fever, chills, sweats,  orthopnea, PND, or leg swelling.   PAST MEDICAL HISTORY:  Significant for hyperlipidemia, childhood asthma,  cholecystectomy, and arthritis.   ALLERGIES:  None known.   MEDICATIONS:  We have previously given her a medication calendar that  she did not bring back with her to the office but the only change she  says is to eliminate Protonix and metoclopramide from the regimen. She  does take ProAir which helps a  little.   SOCIAL HISTORY:  She has never smoked. She has worked in Audiological scientist. She  has no unusual travel, pet, or hobby exposure.   FAMILY HISTORY:  Significant for emphysema in smokers only. Lung cancer  also in her mother.   REVIEW OF SYSTEMS:  Taken in detail and negative as outlined above.   PHYSICAL EXAMINATION:  This is a chronically ill white female who looks  a bit pale today, no acute distress.  SKIN: Warm and dry. She is afebrile, stable vital signs.  HEENT: Unremarkable.  LUNG FIELDS: Reveal diminished breath sounds with prominent pseudo  wheeze.  There is a regular rate and rhythm without murmur,  gallop or rub.  ABDOMEN: Soft, benign.  EXTREMITIES: Warm without calf tenderness, cyanosis, clubbing, or edema.   Chest x-ray shows slight increase in diffuse markings, mild  cardiomegaly.   IMPRESSION:  Flare up of symptoms that has occurred off of PPI and  Reglan. Since she feels exactly the same as she did before and has  prominent pseudo wheeze on exam. I presume the upper airway is again the  culprit and recommended rechallenge with Protonix 40 mg b.i.d.,  metoclopramide 10 mg before meals and at bedtime, and we will also  obtain a lab profile today including a BNP to look for occult heart  failure.   I reviewed with her her p.r.n. list including the use of p.r.n. ProAir  for wheezing, shortness of breath, and Delsym for cough. For now I am  going to leave her on the Qvar but question whether or not this has  really been effective in retrospect. Follow up will be in two weeks,  sooner prn.     Lorraine [name]. Lorraine Sires, MD,  FCCP  Electronically Signed    MBW/MedQ  DD: 03/12/2007  DT: 03/12/2007  Job #: [id number]

## 2010-11-06 NOTE — Discharge Summary (Signed)
Lorraine [name], Lorraine [name] NO.:  [id number]   MEDICAL RECORD NO.:  [id number]          PATIENT TYPE:  INP   LOCATION:  2011                         FACILITY:  [facility]   PHYSICIAN:  Noralyn Pick. Eden Emms, MD, FACCDATE OF BIRTH:  1945/11/29   DATE OF ADMISSION:  05/03/2007  DATE OF DISCHARGE:  05/08/2007                               DISCHARGE SUMMARY   PROCEDURES:  1. Cardiac catheterization.  2. Coronary arteriogram.  3. Left ventriculogram.  4. A 2-D echocardiogram.  5. CT of the head with and without contrast.  6. CT angiogram of the chest.   FINAL PRIMARY DISCHARGE DIAGNOSIS:  Acute systolic congestive heart  failure.   SECONDARY DIAGNOSES:  1. Nonischemic cardiomyopathy with normal coronaries at      catheterization and an ejection fraction of 35%.  2. Possible paroxysmal atrial tachycardia on strip review.  3. Moderate to severe mitral regurgitation with an effective orifice      of mitral regurgitation by proximal isovelocity surface area 0.35      sqcm and a volume of 49 mL, left atrium mild to moderately dilated,      and moderate to severe tricuspid valvular regurgitation with right      atrial dilatation as well and a PAS of 44.  4. Hyperlipidemia.  5. History of childhood asthma.  6. Remote history of cholecystectomy.  7. Osteoarthritis.  8. Recent mental status changes including memory difficulties, follow      up with primary care physician and neurology referral p.r.n.  9. Anemia, iron level 19 and haptoglobin less than 6 with Lorraine [name]      evaluating.  10.Status post esophagogastroduodenoscopy and colonoscopy by Lorraine [name]      showing arteriovenous malformations but no active bleeding at the      time of the test.  11.History of pulmonary nodules, per Dr. [name].  12.Gastroesophageal reflux disease.  13.Hypertension.  14.Hyperlipidemia.  15.History of vocal cord dysfunction.  16.History of an ectopic pregnancy and surgery for this.  17.History  of knee surgery.  18.Hypokalemia.  19.Attention deficit disorder.   TIME AT DISCHARGE:  52 minutes   HOSPITAL COURSE:  Lorraine [name] is a 65 year old female who was seen by Dr.  [name] and evaluated for asthma and dyspnea and severe anemia.  She was  seen by him on May 01, 2007, complaining of increased dyspnea and  palpitations.  She was admitted for further evaluation and treatment.   Cardiology consult called because her BNP was elevated and an  echocardiogram showed an EF of 25-35%.  She was evaluated by Dr.  Gala Romney who felt that cardiac catheterization was indicated to  determine if this was ischemic versus nonischemic cardiomyopathy,  especially since she had regional wall motion abnormalities.  However,  with significant anemia with a hemoglobin of 8.9 and MCV of 71 and iron  level of 19, GI was contacted to get results of the workup she had had  by Lorraine [name] and a hematology consult was called.   Lorraine [name] stated that she had AVMs with two in the small intestine and  one in the  cecum but these were not bleeding at the time of the exam.  He felt that she could be anticoagulated if necessary and transfused on  a p.r.n. basis.  Lorraine [name] evaluated Lorraine [name] and felt that a  ferritin, B12 and fractionated bilirubin was indicated and ordered, and  that she should be on iron four times a day.  He felt that on neurology  consult should be considered if her B12 was normal.  He though she  should be transfused for symptomatic anemia as needed.   Her hemoglobin remained low but stable.  The labs that were performed  looking for reasons for anemia included a glucose-6PD which was within  normal limits at 20.  Her CBC at discharge showed a hemoglobin of 9.8  and hematocrit of 32 which was an improvement.  Her ferritin was 19 and  her B12 was actually elevated at 1453.  A fractionated bilirubin showed  a total bilirubin 3.5 with a direct bilirubin of 1.0 and indirect  bilirubin of  2.5.  ANA was negative and a reticulocyte count was  elevated at 3.6.  A sed rate was within normal limits at 7 and thyroid  function studies were within normal limits as well.  Fecal occult blood  was negative.  CEA was within normal limits at 3.2.  The labs were  reviewed by Lorraine [name] and Dr. Cyndie Chime.  Dr. Cyndie Chime felt that  ancillary tests for hemolysis so far were unrevealing including a Coombs  test which was negative.  He felt it was not clear if there was some  nonimmune homolysis off the faulty mitral valve which is possible but  unusual.  Further evaluation can be done as an outpatient and she is  encouraged to follow up with Lorraine [name].   Lorraine [name] was considered stable for cardiac catheterization on May 06, 2007.  The catheterization was clean and her EF was still  significantly decreased at 35%.  Dr. Juanda Chance felt that her mitral valve  needed further evaluation and recommended a TEE which was performed on  May 07, 2007.   The TEE showed moderate MR with annular dilatation and decreased  posterior leaflet motion but no prolapse.  There was no ASD, no clot and  no aortic debris.  He felt that she should be managed medically at this  time.  Of note, when she was first admitted, because of her mental  status changes a CT angiogram of the head was performed which showed no  acute intracranial abnormality.  A D-dimer had also been checked and was  abnormal so she had a CT angiogram when first admitted that showed no CT  evidence for pulmonary embolus.  The mild mediastinal and bilateral  hilar adenopathy was of indeterminate etiology.  There were numerous  bilateral tiny parenchymal lymph nodules and followup was recommended to  exclude progression.  There were also a tiny bilateral pleural  effusions.   By May 08, 2007, Lorraine [name]'s O2 saturation was between 93% and 97%  on room air.  Her hemoglobin was 9.8 and her potassium, which had  dropped with  the diuresis, was supplemented and within normal limits at  3.9.  A BNP was still slightly elevated at 410 but this can be followed  as an outpatient and reevaluated as needed.  The situation was discussed  with the patient and her family and it was recommended that because of  her various medical issues she not live by herself at this  time.  She  will therefore stay with family members for now.  She was evaluated by  Dr. Eden Emms and considered stable for discharge with outpatient followup  arranged.   DISCHARGE INSTRUCTIONS:  1. Her activity level is to be increased gradually.  2. She is to call our office with any problems with the      catheterization site.  3. She is to weigh herself daily.  4. She is to stick to a 4000 g sodium diet.  5. She is to increase her activity slowly.  6. She is to follow up with Dr. Clent Ridges and has an appointment next week.  7. She is to follow up with Dr. [name] as needed.  8. She is to follow up with Dr. Gala Romney on December 1 at 10:30 a.m.  9. She is encouraged to follow up with Lorraine [name] in hematology as      well.   DISCHARGE MEDICATIONS:  1. Zoloft 100 mg two tablets daily.  2. Protonix 40 mg b.i.d.  3. Aspirin 81 mg daily.  4. Diovan HCT 160/25 mg daily.  5. Travatan eye drops daily.  6. Qvar 40 two puffs b.i.d.  7. Digoxin 0.25 mg daily.  8. Iron 325 mg four times a day, although she states the hematologist      told her she could decrease it to three times a day.  9. Coreg 6.25 mg b.i.d.  10.Calcium plus D is to be restarted when she is able to do so.  11.ProAir p.r.n.  12.She is not to take Strattera or metoclopramide for now.      Theodore Demark, PA-C      Noralyn Pick. Eden Emms, MD, [facility]  Electronically Signed    RB/MEDQ  D:  05/08/2007  T:  05/09/2007  Job:  [id number]   cc:   Jeannett Senior A. Clent Ridges, MD  Leighton Roach Truett [name], M.D.  Charlaine Dalton. [name], MD, FCCP

## 2010-11-06 NOTE — Consult Note (Signed)
NAME:  Lorraine, [name] NO.:  [id number]   MEDICAL RECORD NO.:  [id number]           PATIENT TYPE:   LOCATION:                                 FACILITY:   PHYSICIAN:  Charlynne Pander, D.D.S.DATE OF BIRTH:  28-Jun-1945   DATE OF CONSULTATION:  12/28/2007  DATE OF DISCHARGE:                                 CONSULTATION   Lorraine [name] is a 65 year old female referred by Dr. Tressie Stalker  for dental consultation.  The patient with recent indentification of  severe mitral regurgitation.  The patient with anticipated mitral valve  replacement/repair in the future with Dr. Cornelius Moras.  The patient is now seen  as part of a pre heart valve surgery dental protocol evaluation.   MEDICAL HISTORY:  1. Severe mitral regurgitation.      a.     Status post echocardiogram which revealed moderate-to-severe       mitral vegetation, moderate left ventricular dysfunction, and an       ejection fraction approximately 35%.      b.     Status post cardiac catheterization which revealed normal       coronary arteries.      c.     Anticipated mitral valve replacement/repair with Dr. Cornelius Moras in       the future once cleared dentally.  2. History of anemia.  3. History of Heart failure - class IV.  4. 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  5. History of major depression.  6. Hyperlipidemia.  7. Hypertension.  8. Asthma.  9. Gastric arteriovenous malformation  10.Osteoarthritis.  11.Benign pulmonary nodules  12.Gastroesophageal reflux disorder.  13.Attention deficit disorder.  14.History of hypothyroidism with Synthroid replacement for 15-20      years, but none over the past 5 years.  15.Status post cholecystectomy approximately 20 years ago.  16.Status post exploratory laparotomy for ruptured ectopic pregnancy -      remote.   ALLERGIES:  None known.   MEDICATIONS:  1. Travatan eye drops 1 drop in both eyes every evening for glaucoma.  2. Ferrous  gluconate twice daily.  3. Calcium with vitamin D twice daily.  4. Protonix 40 mg daily.  5. Aspirin 81 mg daily.  6. Carvedilol 6.25 mg twice daily.  7. Prenatal vitamin daily.  8. Wellbutrin 150 mg daily.  9. Loratadine 10 mg daily.  10.Diovan HCT 80/12.5 daily.  11.Xanax as needed for anxiety.  12.ProAir (albuterol) 1-2 puffs every four-six hours as needed.  13.Symbicort 80/4.5 one puff twice daily.   SOCIAL HISTORY:  The patient is divorced.  The patient has 2 children,  one son and one daughter.  The son lives in [place], [place].  The  daughter lives in [place].  The patient has never smoked.  Patient  with rare use of alcohol.   FAMILY HISTORY:  Her mother died at the age 53 from lung cancer.  Father  died of complications from emphysema at the age of 98.   FUNCTIONAL ASSESSMENT:  The patient remains independent for basic ADLs  at this time.  The patient may  need help with instrumental ADLs.   REVIEW OF SYSTEMS:  Reviewed with patient is included in the dental  consultation record.   CHIEF COMPLAINT:  The patient needs pre heart valve surgery dental  protocol evaluation.   HISTORY OF PRESENT ILLNESS:  The patient was recently identified with  severe mitral regurgitation.  The patient with anticipated mitral valve  repair/replacement with Dr. Tressie Stalker in the future.  The patient  now seen as part of a pre heart valve surgery dental protocol evaluation  to rule out dental infection which may affect the patient's systemic  health and anticipated heart valve surgery.   The patient currently denies acute toothache, swellings, or abscesses.  The patient was last seen by a dentist approximately 2-3 years ago.  The  patient indicates that it was a female Education officer, community on [address] in  [place], [place].  The patient is unsure of the name.  The  patient indicates that prior to this she had been seen every 6 months  for an exam and cleaning.   DENTAL EXAM:   GENERAL:  The patient is a well-developed, well-nourished  female in no acute distress  VITAL SIGNS:  Blood pressure is 119/59, pulse rate 80, and temperature  is 97.2.  HEAD AND NECK:  There is no palpable submandibular lymphadenopathy.  There are no acute TMJ symptoms, although the patient has a left TMJ  crepitus.   INTRAORAL EXAM:  The patient with incipient xerostomia.  There is no  evidence of abscess formation within the mouth.  The patient has  bilateral mandibular lingual tori.   DENTITION:  The patient is missing tooth numbers 1, 2, 16, 17, 19, and  32.   PERIODONTAL:  The patient with a chronic periodontitis with incipient  bone loss and minimal plaque accumulations.  There are selective areas  of gingival recession.   ENDODONTIST:  The patient currently denies acute pulpitis symptoms.  There is no evidence of periapical pathology.  The patient has had  multiple previous root canal therapies associated tooth numbers 4, 5,  14, 15, 30, and 31.   DENTAL CARIES:  There are multiple dental caries as per dental charting  form.   CROWN AND BRIDGE:  There are multiple crown and bridge restorations  noted, most of which are acceptable.  Tooth #4 has lost a crown and  tooth #18 and several other teeth could be evaluated for other crown  restorations.   PROSTHODONTIC:  The patient denies history of partial dentures.   OCCLUSION:  The patient with a stable occlusion at this time.   RADIOGRAPHIC INTERPRETATION:  A panoramic x-ray was taken and  supplemented with a full series of dental radiographs.   There are multiple missing teeth.  There is supereruption and drifting  of the unopposed teeth into the edentulous areas.  There are dental  caries noted.  There are multiple dental restorations noted.  There are  multiple root canal therapies noted.  There are no obvious periapical  radiolucencies noted.   ASSESSMENT:  1. Minimal plaque accumulations.  2. Selective areas  of gingival recession.  3. Chronic periodontitis with incipient bone loss.  4. No significant tooth mobility.  5. Multiple missing teeth.  6. Supereruption and drifting of the unopposed teeth into the      edentulous areas.  7. Incipient xerostomia.  8. Bilateral mandibular lingual tori.  9. Dental caries as per dental charting form.  10.Several defective restorations.  11.History of oral neglect.  12.Poor occlusal scheme.   PLAN/RECOMMENDATIONS:  1. I have discussed the risks, benefits, complications, and various      treatment options with the patient in relationship to her medical      and dental conditions and anticipated heart valve surgery.  We      discussed various treatment options to include no treatment, dental      extractions with alveoloplasty, periodontal therapy, dental      restorations, root canal therapy, crown and bridge therapy, implant      therapy, and replacing missing teeth as indicated.  The patient      currently wishes to proceed with a dental cleaning and several      dental restorations.  The patient will then follow up with a      dentist of her choice for evaluation for definitive care including      multiple crown and bridge restorations.  The patient is aware that      she will need antibiotic premedication prior to invasive dental      procedures once the heart valve surgery is performed.  2. Discussion of findings with Dr. Tressie Stalker and Dr. Gala Romney as      indicated.  3. Provision of written and verbal information on Heart Valves and      Mouth Care - today.      Charlynne Pander, D.D.S.  Electronically Signed     RFK/MEDQ  D:  12/28/2007  T:  12/29/2007  Job:  [id number]   cc:   Salvatore Decent. Cornelius Moras, M.D.  Bevelyn Buckles. Bensimhon, MD

## 2010-11-06 NOTE — Assessment & Plan Note (Signed)
[facility]                            CARDIOLOGY OFFICE NOTE   JAYCELYNN, KNICKERBOCKER                    MRN:          [id number]  DATE:07/02/2007                            DOB:          1946-05-12    INTERVAL HISTORY:  Ms. Umholtz is a 65 year old woman with a history of  gastroesophageal reflux disease, asthma, hyperlipidemia, attention  deficit disorder and iron deficiency anemia thought secondary to AVMs.  She was admitted to the hospital several months ago with acute heart  failure symptoms.  Echocardiogram showed an EF of 25% to 35% with  regional wall motion abnormality suggestive of LAD disease.  She  underwent catheterization which showed normal coronaries; however, she  had severe mitral regurgitation with an EF of 35% and she also underwent  transesophageal echocardiogram which confirmed the mitral regurgitation  and a restricted posterior leaflet of the mitral valve.  At our last  visit in December, she was doing some better and we ordered a repeat  echocardiogram and continued her on her ARB and beta blocker.   She returns today for routine followup.  She says she is doing great.  Her shortness of breath has resolved.  She feels like she can do almost  any activity she wants.  I have reviewed her echocardiogram; ejection  fraction has completely recovered; it is now in the 55% to 60% range.  There is a question of a very mild wall motion abnormality persistent  associated with the anteroseptal wall, but this is mild at worst.  Her  mitral regurgitation is almost much improved and now is only mild.  She  has not had any lower extremity edema, no orthopnea, no PND.   CURRENT MEDICATIONS:  1. Travatan.  2. Diovan/hydrochlorothiazide 160/25.  3. Zoloft 200 a day.  4. Protonix 40 a day.  5. Aspirin 81 a day.  6. Carvedilol 6.25 b.i.d.  7. Iron 325 mg b.i.d.   PHYSICAL EXAMINATION:  She is in no acute distress, looks much better,  ambulates around the clinic without any respiratory distress.  Blood pressure is 106/65, heart rate 64, weight is 138, which is down 12  pounds.  HEENT:  Normal.  NECK:  Supple.  There is no JVD.  Carotids are 2+ bilaterally without  bruits.  There is no lymphadenopathy or thyromegaly.  CARDIAC:  PMI is nondisplaced.  She has a regular rate and rhythm with a  soft systolic ejection murmur at the right sternal border.  I do not  hear an MR murmur.  There is no S3.  LUNGS:  Clear.  ABDOMEN:  Soft, nontender and non-distended.  No hepatosplenomegaly.  No  bruits.  No masses.  Good bowel sounds.  EXTREMITIES:  Warm with no cyanosis, clubbing or edema.  No rash.  NEUROLOGIC:  Alert and oriented x3.  Cranial nerves II-XII are intact.  She moves all 4 extremities without difficulty.  Affect is pleasant.   ASSESSMENT AND PLAN:  Transient left ventricular systolic dysfunction  with congestive heart failure; this has now resolved.  Her ejection  fraction is normalized.  I am wondering whether  or not she may have had  a viral cardiomyopathy.  Her mitral regurgitation is also much better.  At this point, we will continue on her current therapy and I will see  her back in 3 months for routine followup.     Bevelyn Buckles. Bensimhon, MD  Electronically Signed    DRB/MedQ  DD: 07/02/2007  DT: 07/02/2007  Job #: [id number]

## 2010-11-06 NOTE — Assessment & Plan Note (Signed)
OFFICE VISIT   Lorraine [name], Lorraine [name]  DOB:  Jan 21, 1946                                        February 15, 2008  CHART #:  [id number]   HISTORY OF PRESENT ILLNESS:  The patient returns for further followup of  severe mitral regurgitation.  She was originally seen in consultation on  December 17, 2007.  Since then, she has been evaluated by Dr. Glendell Docker for severe depression.  She has been taken off of Wellbutrin  and started on Seroquel, and she is actively participating in therapy  with Dr. Andee Poles.  She has made significant progress, although  she continues to battle depression with frequent tearful episodes and  some confusion.  However, overall she has improved somewhat.  She has  additionally been seen in consultation by Dr. Kristin Bruins, and she  underwent dental extraction uneventfully.  She continues to follow up  with Dr. Gala Romney on a regular basis and she returns to our office for  further followup today.  Over the last 2 months, she has apparently  continued to have more and more problems with exertional shortness of  breath, and she also complains of intermittent atypical left-sided chest  discomfort.  The remainder of her review of systems is unchanged from  previously.  The remainder of her past medical history is unchanged.  Current medications are unchanged with exception of the addition of  Seroquel and cessation of Wellbutrin.   PHYSICAL EXAMINATION:  GENERAL:  A well-appearing female, who continues  to intermittently get tearful and upset, but overall she seems to be  under considerably improved control emotionally.  VITAL SIGNS:  Blood pressure 131/77, pulse 75, oxygen saturation 98%.  LUNGS:  Breath sounds are clear to auscultation and symmetrical  bilaterally.  CARDIOVASCULAR:  Regular rate and rhythm with a grade 3/6 systolic  murmur.  ABDOMEN:  Soft, nontender.  EXTREMITIES:  Warm and adequately perfused.   IMPRESSION:   Severe mitral regurgitation with progressive symptoms of  exertional shortness of breath and atypical chest pain.  The patient  suffers from severe depression and anxiety, although she seems to be  making some improvement with both medical therapy and ongoing  counseling.   PLAN:  I have discussed matters at length with the patient and her  daughter here in the office today.  The plan is to follow up with Dr.  Greer Pickerel later this week and they are scheduled to see Dr. Gala Romney the  first of next week.  I have suggested that they ask Dr. Greer Pickerel  directly with respect to the timing of possible elective open heart  surgery.  On one hand, there is no reason to rush into surgery at this  juncture and we could certainly wait for some period of time if she is  making substantial progress under therapy as directed.  On the other  hand, eventually she will undoubtedly continue to have more and more  problems with congestive heart failure, and it would be preferable to  proceed with surgery before she develops acute decompensation.  They  will call our office in the near future after discussing these matters  at length with Dr. Greer Pickerel and Dr. Gala Romney.  In addition, we will  await followup transesophageal echocardiogram, it has been planned by  Dr. Gala Romney, but not yet completed.  All of  their questions have been  addressed.   Salvatore Decent. Cornelius Moras, M.D.  Electronically Signed   CHO/MEDQ  D:  02/15/2008  T:  02/16/2008  Job:  [id number]   cc:   Bevelyn Buckles. Bensimhon, MD  Tera Mater. Clent Ridges, MD  Andee Poles, M.D.  Matthew Dr. Ilda Mori B. [name], MD, FCCP  Charlynne Pander, D.D.S.

## 2010-11-06 NOTE — Cardiovascular Report (Signed)
NAMEHILA, Lorraine [name]             ACCOUNT NO.:  [id number]   MEDICAL RECORD NO.:  [id number]          PATIENT TYPE:  INP   LOCATION:  2011                         FACILITY:  [facility]   PHYSICIAN:  Everardo Beals. Juanda Chance, MD, FACCDATE OF BIRTH:  02/23/1946   DATE OF PROCEDURE:  05/06/2007  DATE OF DISCHARGE:                            CARDIAC CATHETERIZATION   PROCEDURE:  Right and left heart catheterization, coronary angiography  report.   CLINICAL HISTORY:  Ms. [name] is 65 years old.  She was admitted with  symptoms of congestive heart failure.  She had an echocardiogram which  showed ejection fraction of 25-35% and severe mitral regurgitation.  She  was scheduled for evaluation with angiography today.   PROCEDURE:  The procedure was performed via the __________  and 5-French  preformed coronary catheters.  A front wall arterial punch was  performed  and Omnipaque contrast was used.  Right heart catheterization  was performed percutaneously via the right femoral vein using the venous  sheath and Swan-Ganz thermodilution catheter.  The patient tolerated the  procedure well and left the laboratory in satisfactory condition.   RESULTS:  Angiographic data:  The left main coronary.  The left main  coronary artery was a very short vessel that was free of disease.   The left anterior descending artery.  The left anterior descending  artery gave rise to two diagonal branches, two septal perforators and a  third diagonal branch.  These and the LAD proper were free of  significant disease.   The circumflex artery.  The circumflex artery was a dominant vessel and  gave rise to a marginal branch, posterolateral branch and posterior  descending branch as well as two atrial branches.  These vessels were  free of significant disease.   The right coronary artery.  The right coronary artery was a nondominant  vessel and supplied a conus branch and two ventricular  branches.  These  vessels were free  of significant disease.    The left ventriculogram showed global hypokinesis with estimated  fraction of 35%.  There was moderate severe 3+ mitral regurgitation.   Hemodynamic data:  The right arterial pressure was 9 mean.  The  pulmonary artery pressure was 39/19 with mean of 28.  The pulmonary  wedge pressure was 22 with a V-wave of 32-35.  Left ventricular pressure  was 110/13 and the aortic pressure was 110/66 with mean of 85.  Cardiac  output/cardiac index was 4.3/2.4 liters/minutes/meters squared  by Fick.   CONCLUSION:  1. Normal coronary angiography.  2. A moderately severe to severe mitral regurgitation.  3. Left ventricular dysfunction with global hypokinesis and estimated      ejection fraction of 35%   RECOMMENDATIONS:  It is not totally clear whether the patient has a  primary cardiomyopathy and secondary mitral regurgitation, or a primary  mitral regurgitation and a secondary cardiomyopathy, although I am  leaning  toward the latter.  I discussed the findings with Dr. Gala Romney and will  plan further evaluation with a transesophageal echocardiogram to look at  the mitral valve and then decide if we should consider surgical  repair  of that valve.      Bruce Elvera Lennox Juanda Chance, MD, [facility]  Electronically Signed     BRB/MEDQ  D:  05/06/2007  T:  05/07/2007  Job:  [id number]   cc:   Bevelyn Buckles. Bensimhon, MD

## 2010-11-06 NOTE — Assessment & Plan Note (Signed)
[facility]                            CARDIOLOGY OFFICE NOTE   SHERRIN, STAHLE                    MRN:          [id number]  DATE:06/30/2008                            DOB:          May 02, 1946    INTERVAL HISTORY:  Ms. 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  Her EF has been somewhat up and  down ranging from 25%-55%.  Most recent echocardiogram showed an EF of  35-45%, this was in June.  We have referred her to Dr. Cornelius Moras for possible  valve repair, but this has been delayed due to progressive depression  and possible dementia.  She was recently evaluated by [facility] and  had complete neuropsych testing, the results of which we are awaiting  for.   She really seems to be having much more problems with her memory and  depression.  She is having a hard time recalling most facts.  Her  daughter confirms this fact.  They are even considering putting her in  assisted living center.  She also feels quite depressed.  She denies any  significant dyspnea.  No lower extremity edema.  No orthopnea, no PND,  no chest pain.   CURRENT MEDICATIONS:  1. Pantoprazole 40 a day.  2. Aspirin 81 a day.  3. Diovan/HCTZ 160/25 a day.  4. Travatan eye drops.  5. Digoxin 0.25 a day.  6. Iron 325 a day.  7. Coreg 6.25 a day.  8. Calcium and vitamin D.  9. Xanax.  10.Seroquel 400 mg nightly.  11.Lamictal 150 mg at night.  12.Etodolac 500 mg p.r.n.   PHYSICAL EXAMINATION:  GENERAL:  She is in no acute distress.  She is  occasionally tearful, ambulates around the clinic without any  respiratory difficulty.  VITAL SIGNS:  Blood pressure is 120/70, heart rate 74, weight is 164.  HEENT:  Normal.  NECK:  Supple.  No  JVD.  Carotids are 2+ bilaterally without any bruits.  There is no lymphadenopathy or thyromegaly.  CARDIAC:  PMI is nondisplaced.  She has a regular rate and rhythm with  soft mitral regurgitation murmur at the apex.  No gallop. LUNGS:  Clear.  ABDOMEN:  Soft, nontender, nondistended.  No hepatosplenomegaly, no  bruits, no masses.  Good bowel sounds.  EXTREMITIES:  Warm with no  cyanosis, clubbing, or edema.  No rash.  NEUROLOGIC:  She is occasionally forgetful but alert and answers most  questions appropriately, though she does repeat herself.  Cranial nerves  II through XII are intact.  Moves all 4 extremities without difficulty.   ASSESSMENT AND PLAN:  Congestive heart failure secondary to nonischemic  cardiomyopathy in the setting of significant mitral valve regurgitation.  She currently is New York Heart Association class II.  Volume status  looks good.  We will increase her Coreg to 9.375.  I am increasingly  concerned about her neuro cognitive status.  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  We will await neuropsychological testing results.  We will  also check a repeat echocardiogram to make sure everything has been  stable from a cardiac standpoint.     Bevelyn Buckles. Bensimhon, MD  Electronically Signed    DRB/MedQ  DD: 06/30/2008  DT: 07/01/2008  Job #: [id number]

## 2010-11-06 NOTE — Assessment & Plan Note (Signed)
Moderate. Stable on exam. Not candidate for surgical intervention.

## 2010-11-06 NOTE — Assessment & Plan Note (Signed)
[facility]                            CARDIOLOGY OFFICE NOTE   IRINE, HEMINGER                    MRN:          [id number]  DATE:12/14/2007                            DOB:          08-12-1945    INTERVAL HISTORY:  Ms. Wellborn is a 65 year old woman with a history of  hypertension, hyperlipidemia, asthma, gastric AVMs with associated  anemia, and previous LV dysfunction who presents for routine followup.   She was first admitted in November with respiratory distress and altered  mental status.  Initial echo showed an EF of 25-35% with severe mitral  regurgitation and regional wall motion abnormalities.  she underwent  catheterization by Dr. Juanda Chance, and it showed normal coronary arteries  and the EF about 35% and mild-to-moderate severe mitral regurgitation.  The question whether or not the mitral regurgitation was primary or  secondary to the LV dysfunction.  She underwent TEE, which confirmed the  findings.  The posterior leaflet of the mitral valve was restricted but  no other valvular pathology associated with mitral valve.   She was treated medically.  She did much better.  Ejection fraction  improved to 55-60% by June, and she was feeling great.  I saw her back a  little while ago, and she was starting to feel weak again.  She will get  another echocardiogram.  Echocardiogram, which I reviewed shows an  ejection fraction of 35-40%.  Once again, there are several regional  wall motion abnormalities and  moderate-to-severe mitral regurgitation, which is posterior in nature.   Clinically, she is feeling terrible.  She is very depressed.  She lost  her job.  She is having problems with her memory and so it is hard for  her to do anything.  She is having two-pillow orthopnea and significant  exercise intolerance.  She is not having any volume overload.  No PND.   She saw Dr. Valetta Mole who felt that her memory issues were most likely  due to  delirium related to her heart failure.  She has not had fevers or  chills.  No rash.   CURRENT MEDICATIONS:  1. Travatan eye drops.  2. Wellbutrin 150 a day.  3. Protonix.  4. Aspirin 81.  5. Iron.  6. Carvedilol 6.25 b.i.d.  7. Loratadine 10 a day.   Previously stopped her Diovan/hydrochlorothiazide.   PHYSICAL EXAMINATION:  VITAL SIGNS:  She is quite tearful and distress.  VITAL SIGNS:  Blood pressure is 134/80, heart rate is 70, weight is 157,  which is up 15 pounds.  HEENT:  Normal.  NECK:  Supple.  There is no obvious JVD.  Carotids are 2+ bilaterally.  No bruits.  There is no lymphadenopathy or thyromegaly.  CARDIAC:  PMI  is nondisplaced.  She is regular with 2/6 holosystolic murmur at the  apex.  Radiating to the back.  LUNGS:  Clear.  ABDOMEN:  Soft, nontender, and nondistended.  There is no  hepatosplenomegaly.  No bruits.  No masses.  Good bowel sounds.  EXTREMITIES:  Warm with no cyanosis, clubbing, or edema.  No rash.  NEURO:  She is alert and oriented x3.  Cranial nerves II-XII grossly  intact.  She moves all 4 extremities without difficultly.  Affect is  distress.   ASSESSMENT AND PLAN:  1. Acute on chronic systolic heart failure.  I went back and compared      all her echos side-by-side.  She does in fact have recurrent left      ventricular dysfunction and a very restricted posterior MV leaflet.      I am unsure what the etiology of this waxing and waning phenomenon      is.  I suspect it  may be related to her mitral valve and that is      the primary issue here.  Alternatively, I guess she could have      coronary vasospasm or a underlying viral pathogen, which may also      be leading to her altered mental status, though I think this is      unlikely.  She does not have low flow syndrome, so I am doubtful      that her heart failure is actually the cause of her mental status      changes.  At this point, we will restart her Diovan/       hydrochlorothiazide 40/12.5.  I will review the echocardiograms      with Dr. Jens Som, looking specifically at the mitral valve.  If we      do feel that the mitral valve is the main issue here, it maybe      worthwhile having her see a surgeon for possible surgical repair.      I have also asked her to send me the blood work that has been drawn      so far.  We can see what has been done.  If I recall correctly, her      serologies for autoimmune disorders were negative.   DISPOSITION:  I will get back to her and her daughter after reviewing  her echos.   Total time spent with the patient and reviewing chart and  echocardiograms is 1 hour.     Bevelyn Buckles. Bensimhon, MD  Electronically Signed    DRB/MedQ  DD: 12/14/2007  DT: 12/15/2007  Job #: [id number]   cc:   Leslye Peer, MD  Melvyn Novas, M.D.  Leighton Roach. Truett Perna, M.D.

## 2010-11-06 NOTE — Assessment & Plan Note (Signed)
[facility]                             PULMONARY OFFICE NOTE   Lorraine, [name]                    MRN:          [id number]  DATE:03/27/2007                            DOB:          1946/02/10    HISTORY OF PRESENT ILLNESS:  The patient is a 65 year old female patient  of Dr. [name], who presents today for a 1-week followup for medication  review.  The patient has brought all of her medications in today for  review. The patient has had several recent changes to her regimen.  The  patient was noted last visit to have some anemia with a hemoglobin of  8.8 and an MCV of 79.  The patient does report that she has had a  colonoscopy approximately 2 years ago with Dr. Elnoria Howard, but she is unaware  of what those results are.  She denies any known history of anemia in  the past.  She denies any bloody stools, abdominal pain, or weight loss.  The patient was started on iron therapy last visit.  The patient has a  history of chronic asthma and was having some increased shortness of  breath.  She was felt to have some upper airway instability possibly  secondary to reflux and was recommended to maintain on an aggressive  reflux prevention with Protonix twice daily and Reglan 4 times a day.  The patient also was continued on QVAR 40 mcg 2 puffs twice daily.  The  patient's lab work did reveal an elevated BNP level at 678 and the  patient was started on Diovan/HCT 160/25 mg daily.  The patient returns  back today reporting that she does feel much better.  She has decreased  shortness of breath, cough, and wheezing.  The patient denies any  hemoptysis, orthopnea, PND, or leg swelling.   PAST MEDICAL HISTORY:  Reviewed.   CURRENT MEDICATIONS:  Reviewed.   PHYSICAL EXAMINATION:  GENERAL:  The patient is a pleasant female in no  acute distress.  VITAL SIGNS:  She is afebrile with stable vital signs.  O2 saturation is  96% on room air.  Weight is 177.  HEENT:   Unremarkable.  NECK:  Supple without cervical adenopathy.  No JVD.  RESPIRATORY:  Lung sounds reveal diminished breath sounds at the bases,  otherwise clear.  CARDIAC:  Regular rate.  ABDOMEN:  Soft and nontender.  No palpable hepatosplenomegaly.  EXTREMITIES:  Warm without any calf tenderness, cyanosis, clubbing.  There is no edema.   IMPRESSION AND PLAN:  1. Chronic asthma with recent flare possibly secondary to reflux.  The      patient is to continue on aggressive reflux prevention with      Protonix twice daily and Reglan 4 times a day and QVAR 40 two puffs      twice a day.  The patient will return back in 6-8 weeks with Dr.      [name] or sooner as needed.  2. Lower extremity edema and hypertension.  The patient seems to be      much improved on Diovan/HCT.  We will recheck a  BNP level today.  3. Anemia of questionable etiology.  The patient does not have a      primary care physician.  I have recommended that she contact with      the internal medicine group at [facility] for possible establishment.      Also, the patient has been seen by Dr. Elnoria Howard in the past and we will      refer her back over to his office for followup of her anemia.  Iron      levels along with B12 and folate levels are pending and we will fax      the results over to Dr. Haywood Pao office when available.  4. Complex medication regimen.  The patient's medications were      reviewed in detail.  Patient education was provided and a      computerized medication calendar was completed for this patient and      reviewed in detail.      Rubye Oaks, NP  Electronically Signed      Charlaine Dalton. [name], MD, [facility]  Electronically Signed   TP/MedQ  DD: 03/27/2007  DT: 03/27/2007  Job #: [id number]

## 2010-11-06 NOTE — Patient Instructions (Signed)
Your physician wants you to follow-up in:  6 months. You will receive a reminder letter in the mail two months in advance. If you don't receive a letter, please call our office to schedule the follow-up appointment.   

## 2010-11-06 NOTE — Assessment & Plan Note (Signed)
Lorraine [name]                            CARDIOLOGY OFFICE NOTE   Lorraine [name], Lorraine [name]                    MRN:          [id number]  DATE:02/23/2008                            DOB:          04/03/1946    INTERVAL HISTORY:  Ms. Oberle is a 65 year old woman with a history of  gastroesophageal reflux disease, asthma, hyperlipidemia, depression,  iron-deficiency anemia, thought secondary to AVMs.  We have been  following her in the [facility] secondary to severe  mitral regurgitation with a restricted posterior leaflet.   She has been evaluated by Dr. Cornelius Moras for possible mitral valve repair, but  this has been on hold due to her severe debilitating depression.   She is continuing to follow up with her psychiatrist and psychologist  and they have changed her medicines.  Apparently, her tearfulness is  mildly better.  However, she is now having significant problems with her  memory and other cognitive function.   She denies any chest pain or shortness breath.  No lower extremity  edema.  She has had chronic two-pillow orthopnea and no PND.   CURRENT MEDICATIONS:  1. Xanax 0.5 t.i.d. and antidepressant, she does not know the name of      .  2. Seroquel 400 a day.  3. Travatan eye drops.  4. Protonix 40 a day.  5. Aspirin 81.  6. Iron.  7. Coreg 6.25 b.i.d.  8. Loratadine.  9. Diovan b.i.d., unclear dose.   PHYSICAL EXAMINATION:  GENERAL:  He is obviously somewhat confused,  occasionally distraught.  She ambulates around the clinic without  respiratory difficulty.  There is no acute distress.  VITAL SIGNS:  Blood pressure is 115/60, heart rate is 73, weight is 159.  HEENT:  Normal.  NECK:  Supple.  There is no JVD.  Carotids are 2+ bilaterally without  any bruits.  There is no lymphadenopathy or thyromegaly.  CARDIAC:  PMI is nondisplaced.  She has regular rate and rhythm.  There  is only a soft mitral regurgitation murmur at  the apex.  No S3.  LUNGS:  Clear.  No wheezing.  ABDOMEN:  Soft, nontender, and nondistended.  No hepatosplenomegaly.  No  bruits.  No mass.  EXTREMITIES:  Warm.  No cyanosis, clubbing, or edema.  NEURO:  She is confused at times.  Cranial nerves are grossly intact.  Moves all four extremities without difficulty.  She has some depression.   EKG shows sinus rhythm at a rate of 73 with a chronic left bundle-branch  block.   ASSESSMENT AND PLAN:  Severe mitral regurgitation with left ventricular  dysfunction, ejection fraction about 30%.  She has pending mitral valve  repair with Dr. Cornelius Moras.  However, I quite concerned that she now has what  appears to be early onset dementia; however, I am not sure that this is  just a by product of her depression.  At this point, I would recommend  continuing medical therapy until we are sure that her cognitive status  will improve.  Otherwise, I think her outcome of surgery will be poor.  We  will continue on her current medications and we will see her back in  clinic in 6-8 weeks to further evaluate.  I have discussed this with Dr.  Cornelius Moras.     Bevelyn Buckles. Bensimhon, MD  Electronically Signed    DRB/MedQ  DD: 02/23/2008  DT: 02/24/2008  Job #: [id number]   cc:   Salvatore Decent. Cornelius Moras, M.D.

## 2010-11-06 NOTE — Consult Note (Signed)
NEW PATIENT CONSULTATION   Lorraine [name], Lorraine [name]  DOB:  1945/10/25                                        December 17, 2007  CHART #:  [id number]   REQUESTING PHYSICIAN:  Bevelyn Buckles. Bensimhon, MD   REASON FOR CONSULTATION:  Mitral regurgitation.   HISTORY OF PRESENT ILLNESS:  The patient is a 65 year old divorced  female who lives here in [place].  She has been followed for more  than a year and a half by Dr. Francella Solian for symptoms of exertional  shortness of breath that initially were attributed to asthma.  On  November 2008, she presented with worsening severe shortness of breath  at rest.  She was also noted to have severe iron-deficient anemia.  She  was hospitalized and diagnosed with acute systolic congestive heart  failure.  Echocardiogram was performed demonstrating  moderate-to-severe mitral regurgitation with moderate left ventricular  dysfunction, ejection fraction estimated 35%.  She underwent cardiac  catheterization during that admission revealing normal coronary arteries  with no coronary artery disease.  There was mild pulmonary hypertension  but a V-wave was noted on wedge pressure consistent with severe mitral  regurgitation.  She underwent a transesophageal echocardiogram during  that admission confirming the presence of at least moderate-to-severe  mitral regurgitation.  There was mild aortic insufficiency.  There was  no sign of atrial septal defect or interatrial communication.  There was  global left ventricular dysfunction with ejection fraction estimated  35%.  The patient was treated medically with considerable improvement.  Since that time, she has been followed carefully in the office by Dr.  Gala Romney.  She recently returned for followup in June, and although she  had been doing better by June, she was having more trouble with  exertional shortness of breath and two-pillow orthopnea with severe  exercise intolerance.  She was also  notably very depressed as she lost  her job in January of this year.  She had been seen by Dr. Vickey Huger from  the [facility]rology Service due to concerns regarding some cognitive problems  and memory loss that were discovered during her hospitalization in last  November.  Dr. Vickey Huger felt that her memory issues were most likely  related to delirium secondary to her heart failure and other medical  problems, and she did not feel that there were other organic causes for  her memory problems.  A repeat echocardiogram was performed on November 24, 2006.  This continues to demonstrate at least  moderate-to-severe mitral regurgitation with moderate left ventricular  dysfunction.  The patient has now been referred to consider elective  mitral valve repair or replacement.   REVIEW OF SYSTEMS:  GENERAL:  The patient has been very distraught in  recent weeks and months and clearly depressed.  However, she has been  the eating reasonably well, and she has gained some weight back after  her hospitalization last fall.  Her energy level is poor.  CARDIAC:  The  patient denies any exertional chest discomfort.  She does have  occasional atypical chest pain radiating across the left chest that seem  to come and go sporadically.  She clearly has at least moderate  exertional shortness of breath.  She has two-pillow orthopnea.  She has  occasional palpitation.  She has not had any syncopal episode.  She does  not have lower extremity edema.  RESPIRATORY:  Notable for the absence  of any cough, hemoptysis, or wheezing.  She does have wheezing with  exertional activity and shortness of breath, but it is always brought on  with activity.  GASTROINTESTINAL:  Negative.  The patient has no  difficulty swallowing.  She denies any recent history of hematochezia,  hematemesis, or melena.  She has been followed by Dr. Elnoria Howard from GI  Service and has undergone colonoscopy with findings suggestive of  colonic AVMs, but no active  sources of bleeding in the past.  MUSCULOSKELETAL:  Notable for chronic arthritis afflicting both knees.  This limits her ambulation some, but she is able to walk without a  walker or with cane.  NEUROLOGIC:  Negative.  The patient denies any  transient monocular blindness, transient numbness or weakness involving  either upper or lower extremity.  HEENT:  Notable and that the patient  has not seen a dentist in several years.  She says that she has some  teeth that need work but she denies any ongoing painful areas in her  teeth.  GENITOURINARY:  Negative.  PSYCHIATRIC:  The patient clearly has  acute depression.  She is tearful off and on all the time and she cannot  explain why she is crying.  She feels like she is not worth anything to  anybody any more, and she questions whether or not there is any reason  for her to live.  This seems to have developed acutely since she lost  her job in January, and is made worse by her ongoing medical issues as  well as her inherent problems and short-term memory problems.   PAST MEDICAL HISTORY:  1. Mitral regurgitation.  2. Congestive heart failure.  3. Major depression.  4. Hyperlipidemia.  5. Hypertension.  6. Asthma.  7. Gastric AV malformations.  8. Iron deficient anemia.  9. Osteoarthritis.  10.Benign pulmonary nodules.  11.GE reflux disease.  12.Attention deficit disorder.  13.Mild short-term memory loss.   PAST SURGICAL HISTORY:  1. Cholecystectomy.  2. Exploratory laparotomy for ruptured ectopic pregnancy.   FAMILY HISTORY:  Noncontributory.   SOCIAL HISTORY:  The patient is divorced and lives alone here in  [place].  Her sister and daughter live nearby and are very  supportive.  Her daughter accompanies her to the office today.  She is a  nonsmoker.  She denies alcohol use.   CURRENT MEDICATIONS:  1. Travatan eye drops.  2. Iron sulfate.  3. Calcium and vitamin D.  4. Protonix 40 mg daily.  5. Aspirin 81 mg daily.   6. Iron gluconate twice daily.  7. Coreg 25 mg twice daily.  8. Prenatal vitamin.  9. Wellbutrin 150 mg daily.  10.Loratadine 10 mg daily.  11.Diovan/HCT 80/12.5 one tablet daily.  12.Xanax as needed for anxiety.   DRUG ALLERGIES:  None known.   PHYSICAL EXAMINATION:  GENERAL:  The patient is a well-appearing female  who is tearful and obviously distraught.  VITAL SIGNS:  She is afebrile.  Blood pressure 124/66, pulse 82 and  regular, and oxygen saturation 98% on room air.  HEENT:  Grossly unrevealing.  NECK:  Supple.  There is no palpable lymphadenopathy.  There is no  jugular venous distention.  CHEST:  Auscultation of the chest demonstrates clear breath sounds,  which are symmetrical bilaterally.  No wheezes or rhonchi noted.  CARDIOVASCULAR:  Notable for regular rate and rhythm.  There is a grade  2-3/6 systolic murmur  heard best at the apex.  No diastolic murmurs are  noted.  ABDOMEN:  Soft, nondistended, and nontender.  Bowel sounds are present.  EXTREMITIES:  Warm and well-perfused.  Femoral pulses are strong and  easily palpable in both groins.  Distal pulses are thready but palpable  in the posterior tibial position.  There is no venous insufficiency.  SKIN: Clean, dry, healthy-appearing throughout.  RECTAL:  Deferred.  GU:  Deferred.   DIAGNOSTIC TESTS:  A 2-D echocardiogram performed on May 02, 2007,  June 03, 2007, and more recently November 24, 2007, have all been  reviewed and transesophageal echocardiogram performed on May 07, 2007, has also been reviewed.  These examinations all demonstrate at  least moderate-to-severe mitral regurgitation.  There appears to be  functional restriction of the posterior leaflet of the mitral valve with  a broad central jet of mitral regurgitation.  There may be some  thickening of the subvalvular apparatus, and it is possible this could  be a rheumatic valve, although there does appear to be some motion of  the posterior  leaflet and the anterior leaflet moves well.  There is  mild aortic insufficiency.  The aortic valve is tricuspid and all three  leaflets opened and closed normally.  The jet of aortic regurgitation is  central and narrow.  There is moderate left ventricular dysfunction with  ejection fraction estimated 35-40%.  There are no regional wall motion  abnormalities of significance.  Cardiac catheterization performed  May 06, 2007, is reviewed.  This demonstrates normal coronary  artery anatomy with the left dominant coronary circulation.  There was  no coronary artery disease appreciated at all.  Right heart  catheterization data from cardiac catheterization notable for PA  pressure measured 39/19 with pulmonary capillary wedge pressure 22 and a  V-wave of 35.  Cardiac output was 4.3 liters per minute corresponding to  cardiac index of 2.4.  Central venous pressure was 9.  There was  moderate left ventricular dysfunction with ejection fraction estimated  35% and at least moderate-to-severe mitral regurgitation on left  ventriculogram injection.   IMPRESSION:  Symptomatic severe mitral regurgitation with longstanding  symptoms of exertional shortness of breath and episode of class IV  congestive heart failure in November 2008.  Although, the patient  initially did well on medical therapy, she is having more trouble with  exertional shortness of breath and fatigue.  I think she would benefit  from correction of her mitral regurgitation.  I suspect that her valve  will be repairable, although it is possible this could be rheumatic  valve.  The images from transesophageal echocardiogram performed in  November 2008 are not adequate for definitive assessment at this time.  The patient also clearly has acute depression, perhaps even bordering on  psychosis.  This in fact may be her more pressing problem at the current  time.   PLAN:  I have discussed matters at length with the patient and  her  daughter here in the office today.  I think she needs to see a  psychiatrist as soon as possible for professional help bringing her  acute depression under control.  Once this has been accomplished, we can  talk further about possible minimally invasive mitral valve repair to  improve for exercise tolerance and her long-term prognosis.  Repeat  transesophageal echocardiogram would be useful for further functional  assessment of the mitral valve anatomy and to further assess whether or  not we think valve repair  will be likely rather than valve replacement.  Dental consults is necessary as well.  We will plan to see the patient  back in 8 weeks.  During the interim, she will follow up with Dr.  Gala Romney for repeat transesophageal echocardiogram.  Her daughter knows  a psychiatrist here in town and  will look into getting her evaluated as soon as practical.  We will  refer her to Dr. Kristin Bruins for thorough dental consultation and exam.  All their questions have been addressed.   Salvatore Decent. Cornelius Moras, M.D.  Electronically Signed   CHO/MEDQ  D:  12/17/2007  T:  12/18/2007  Job:  [id number]   cc:   Bevelyn Buckles. Bensimhon, MD  Tera Mater. Clent Ridges, MD  Charlaine Dalton. [name], MD, FCCP  Charlynne Pander, D.D.S.

## 2010-11-06 NOTE — Assessment & Plan Note (Signed)
[facility]                            CARDIOLOGY OFFICE NOTE   BRITTANEY, BEAULIEU                    MRN:          [id number]  DATE:04/21/2008                            DOB:          December 25, 1945    INTERVAL HISTORY:  Lorraine [name] is a 65 year old woman with a history of  gastroesophageal reflux disease, asthma, hyperlipidemia, depression, and  iron deficiency thought secondary to AVMs.  We have been following for  congestive heart failure secondary to severe mitral regurgitation with  restricted posterior leaflet.  Her ejection fraction has been somewhat  up and down.  Most recently, it has been in the 35-45% range with  moderate-to-severe mitral regurgitation.  She has been seeing Dr. Cornelius Moras,  who is considering a valve repair, but this has been delayed due to  severe depression and possible dementia.  She has been following with  Psychiatry.  She also has an appointment with [facility] on Monday.   She is here with her sister.  Overall, still her mood has improved, but  the medicines are making her fatigued.  She is much less tearful.   From a cardiac point view, she feels pretty good.  She does get short of  breath when she walks a quarter to half mile or so, but she also stops  due to her knee pain.  She has chronic 2-pillow orthopnea.  No PND.  No  lower extremity edema.  No chest pain.   CURRENT MEDICATIONS:  1. Pantoprazole 40 a day.  2. Aspirin 81 a day.  3. Diovan/HCTZ 160/25 a day.  4. Travatan eye drops.  5. Digoxin 0.25 a day.  6. Iron 325 a day.  7. Coreg 6.25 a day.  8. Calcium and vitamin D.  9. Prenatal vitamins.  10.Xanax 0.25 t.i.d.  11.Seroquel 400 at night.  12.Lamictal 150 nightly.  13.Etodolac.   PHYSICAL EXAMINATION:  GENERAL:  She makes poor eye contact, but she is  not tearful.  She ambulates around the clinic without any respiratory  difficulty.  VITAL SIGNS:  Blood pressure is 106/64, heart rate is 90, and  weight is  165.  HEENT:  Normal.  NECK:  Supple.  No JVD.  Carotids are 2+ bilaterally without any bruits.  There is no lymphadenopathy or thyromegaly.  CARDIAC:  PMI is not displaced.  She has regular rate and rhythm with  soft mitral regurgitation murmur at the apex.  There is no gallop.  LUNGS:  Clear.  ABDOMEN:  Soft, nontender, and nondistended.  No hepatosplenomegaly, no  bruits, no masses.  Good bowel sounds.  EXTREMITIES:  Warm with no cyanosis, clubbing, or edema.  No rash.  NEURO:  Alert and oriented x3.  Cranial nerves II-XII are intact.  Moves  all 4 extremities without difficulty.  Affect is flattened.   ASSESSMENT AND PLAN:  1. Mitral regurgitation.  This is stable.  Continue current therapy.  2. Congestive heart failure secondary to nonischemic cardiomyopathy in      the setting of his significant valvular disease.  She is currently      New [place] Heart Association  Class II.  Her volume status looks good.      Given her blood pressure, I am not going to titrate her beta-      blocker at this time.  We will continue current therapy.  3. Depression with a question of dementia.  She will see [facility] next week.  We will await for their report.   DISPOSITION:  Overall, she is stable.  We will continue to follow her.  Given her ongoing derpression, I do not think she is ready for mitral  valve surgery at this point.  We will see how she continues to improve  and if so we will have her see Dr. Cornelius Moras again in near future.     Bevelyn Buckles. Bensimhon, MD  Electronically Signed    DRB/MedQ  DD: 04/21/2008  DT: 04/21/2008  Job #: [id number]   cc:   Salvatore Decent. Cornelius Moras, M.D.

## 2010-11-06 NOTE — Cardiovascular Report (Signed)
NAME:  KAYLEANN, MCCAFFERY             ACCOUNT NO.:  [id number]   MEDICAL RECORD NO.:  [id number]           PATIENT TYPE:   LOCATION:                                 FACILITY:   PHYSICIAN:  Noralyn Pick. Eden Emms, MD, [facility]   DATE OF BIRTH:   DATE OF PROCEDURE:  DATE OF DISCHARGE:                            CARDIAC CATHETERIZATION   TRANSESOPHAGEAL ECHO   Dictation on Shelena Castelluccio   INDICATIONS:  Congestive heart failure, question degree of MR.   The patient was sedated with 7 mg of Versed and 75 mcg of fentanyl.   Using digital technique and Omniplane probe, it was advanced into the  distal esophagus without incident.  Transgastric imaging showed there to  be moderate left ventricular cavity enlargement with global hypokinesis.  The EF was in the 35% range.  There are no discrete regional wall motion  abnormalities.  The mitral annulus was dilated.  There was decreased  excursion of the posterior leaflet.  There was no prolapse.  Overall, I  would grade the degree of MR as moderate to moderate to severe at most.  Using a 1-4 scale, I would grade it as 2-3.  The degree of MR is likely  representative of the primary cardiomyopathic process.  The PISA radius  is only in the 0.7 range and there was no flow reversal in the pulmonary  veins.  There was moderate left atrial enlargement and mild right atrial  enlargement.  Right-sided cardiac chambers were normal.  The aortic  valve was trileaflet with mild aortic insufficiency.  There was no ASD.  Imaging in the aorta showed no significant debris.  There was no left  atrial appendage thrombus.   FINAL IMPRESSION:  1. Global hypokinesis and moderate left ventricular cavity      enlargement.  Ejection fraction 35% range.  2. Mitral annular dilatation with decreased excursion of the posterior      leaflet, moderate to at most moderate to severe mitral      regurgitation.  3. Left atrial enlargement.  4. Mild aortic insufficiency.  5. Normal  right-sided cardiac chambers.  6. No aortic debris.  7. No left atrial appendage clot.   The patient will likely be discharged tomorrow for further workup.  Initial medical management is probably indicated.  The patient has other  medical issues such as her anemia, AVMs, and mental status changes that  need further workup.     Noralyn Pick. Eden Emms, MD, [facility]  Electronically Signed    PCN/MEDQ  D:  05/07/2007  T:  05/08/2007  Job:  [id number]

## 2010-11-06 NOTE — H&P (Signed)
Lorraine [name], DEOL NO.:  [id number]   MEDICAL RECORD NO.:  [id number]          PATIENT TYPE:  INP   LOCATION:  2011                         FACILITY:  [facility]   PHYSICIAN:  Bevelyn Buckles. Bensimhon, MDDATE OF BIRTH:  07-03-1945   DATE OF ADMISSION:  05/03/2007  DATE OF DISCHARGE:                              HISTORY & PHYSICAL   DICTATED FOR:  Bevelyn Buckles. Bensimhon, MD.   CHIEF COMPLAINT:  Progressive shortness of breath.   HISTORY OF PRESENT ILLNESS:  This is a 65 year old woman with history of  GERD, asthma, and hyperlipidemia who has noted a progressive 3-4 month  increasing shortness of breath along with weakness.  She originally seen  a pulmonologist, Dr. [name] who felt the patient may have had some vocal  cord dysfunction and GERD.  At that time, she was placed on aggressive  proton pump inhibitors and had continued workup.  This continued workup  revealed that the patient had an iron deficiency anemia of unclear  etiology.  She apparently had a negative colonoscopy 2 years ago.  She  was placed on iron supplementation; however, has continued to have  progressive decline with shortness of breath at rest and extreme  weakness to the point that she can barely stand up.  She also notes a  deteriorating memory over this period of time.  She denies any problems  with any chest pain.  She states that she occasionally has palpitations.  She does state that she gets some PND, and states that she has only had  pedal edema.  She also endorses a weight loss of 20-25 pounds over the  past 3-4 months.  She denies any lightheadedness, dizziness, presyncope,  or syncope.  She denies any cough, fever, or chills at this point in  time.  She denies any hematochezia, melena, dysuria, or other  complaints.   Given her symptoms of progressive dyspnea, she was admitted to [facility] by Dr. [name] for further workup.  She had an echocardiogram today  that was performed that showed an  EF of approximately 30% with  hypokinesia anterior wall.  She had a mildly positive troponin of 0.07;  however, she had 3 other sets of markers that showed not significant  troponins and no elevation in her CK or CK-MB.  At this point in time,  she has been transferred to [facility] cardiac catheterization in the  morning to further delineate the etiology of her LV dysfunction.  There  is some concern that this could represent Takotsubo cardiomyopathy.   PAST MEDICAL HISTORY:  1. Hyperlipidemia.  2. Childhood asthma.  3. History of cholecystectomy.  4. Osteoarthritis.   ALLERGIES:  None.   HOME MEDICATIONS:  1. Qvar 40, 2 puffs b.i.d.  2. Strattera 80 mg once daily.  3. Protonix 40 mg b.i.d.  4. Metoclopramide 10 mg q.i.d.  5. Travatan eye drops one each eye daily.  6. Diovan HCT 160/25 mg daily.  7. Flarex 150 once daily.  8. Calcium with vitamin D one tablet twice daily.  9. Zoloft 200 mg daily.   SOCIAL HISTORY:  She  lives in [place].  She lives alone.  She is an  Airline pilot.  She has no habits.   FAMILY HISTORY:  Significant for a father who had significant coronary  disease, but died of complications related to emphysema and was a  smoker.  Her mother also died of complications with emphysema.   REVIEW OF SYSTEMS:  As above in the HPI.  Remaining 18-point review of  systems is negative.   VITALS:  On transfer, temperature of 98.6, pulse of 71, respiratory rate  of 16, blood pressure of 110/70, O2 saturations 94% on room air.  GENERAL:  In general, she is in no acute distress.  Alert and oriented  x3.  HEENT EXAM:  Pupils equal, round, reactive to light.  Sclerae anicteric.  NECK:  Supple with no lymphadenopathy, no carotid bruits, normal  thyroid.  Her JVP is to the level of her ear.  LUNGS:  Her lungs are clear to auscultation bilaterally without any  wheezes, rhonchi, or rales.  CARDIOVASCULAR EXAM:  Regular rate and rhythm.  Normal S1, S2 without  any  murmurs, rubs, or gallops.  ABDOMEN:  Soft, nontender, nondistended without hepatosplenomegaly.  There is no palpable masses.  EXTREMITY EXAM:  2+ pulses radially in her posterior tibialis area.  There is no lower extremity edema, clubbing, or cyanosis.  She has no  skin rashes.  Her joints are all normal without any overlying erythema,  fluctuance, or effusions.  NEUROLOGIC EXAM:  Cranial nerves III-XII are intact.  She has some mild  weakness throughout in all muscle groups about 4/5 that are symmetric  bilaterally.   LABORATORY DATA:  A CT angiogram was negative for PE.  Final  echocardiogram report is pending; however, preliminary report showed an  EF of 30% with some dysfunction in the anterior wall that was worse than  other wall motions.  Chest x-ray on May 01, 2007, showed  cardiomegaly without any edema or effusions.  An EKG on arrival to [facility] showed sinus tachycardia at a rate of 105 with a left axis  deviation with non-specific intraventricular conduction delay, normal  POR anteroseptal Q-waves without any acute ST or T-wave changes.  She  had a single axillary PVC.  Hematocrit of 28, white count of 7.9,  platelets of 229.  Potassium of 4.1, creatinine of 1.3, glucose of 114.  TSH is 1.58.  A free T4 is 1.2.  She has had 4 negative sets of CK-MBs.  She has had 3 negative sets of troponin I with one that was 0.07.  A BNP  that was 449.   ASSESSMENT:  Progressive dyspnea with diagnosis of new onset left  ventriclular dysfunction.  Possible etiologies include Takotsubo  cardiomyopathy or occult coronary disease.   PLAN:  The patient will be admitted to telemetry bed here at [facility]  She will be continued on her outpatient medications.  We will plan on  cardiac cath in the morning.  Remaining management will be dictated by  Dr. Gala Romney and the primary team.     ______________________________  Eston Esters. Sherryll Burger, MD      Bevelyn Buckles. Bensimhon, MD   Electronically Signed    BRS/MEDQ  D:  05/03/2007  T:  05/03/2007  Job:  [id number]

## 2010-11-06 NOTE — Assessment & Plan Note (Signed)
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  We have been  following her for congestive heart failure secondary to severe mitral  regurgitation with a restricted posterior leaflet.   Since we last saw her, she saw Dr. Cornelius Moras who is considering her for valve  repair, but given her severe depression he would like to see this  improved prior to surgery.  She has been following up with Psychiatry.   She continues to have severe depression and cries almost any time.  From  a cardiac point of view, her breathing has been better.  She has not had  any lower extremity edema.  She does note that occasionally she feels a  little bit worse and then better, so she has had sort of fluctuating  course.  No orthopnea or PND.   CURRENT MEDICATIONS:  1. Travatan.  2. Diovan HCTZ 160/25.  3. Bupropion 150 a day.  4. Vitamins.  5. Carvedilol 6.25 b.i.d.  6. Iron.  7. Aspirin 81.  8. Protonix.  9. Xanax.  10.Calcium.   PHYSICAL EXAMINATION:  GENERAL:  She is quite tearful.  She ambulates in  the clinic without any respiratory difficulty.  No acute distress.  VITAL SIGNS:  Blood pressure is 110/55, heart rate is 84, and weight is  152.  HEENT:  Normal.  NECK:  Supple.  No JVD.  Carotids are 2+ bilaterally without any bruits.  There is no lymphadenopathy or thyromegaly.  CARDIAC:  PMI is nondisplaced.  She is regular rate and rhythm.  There  is a soft mitral regurgitation murmur at the apex, which I can only hear  in the left lateral decubitus position.  No S3.  LUNGS:  Clear.  No wheezing.  ABDOMEN:   Soft, nontender, and nondistended.  No hepatosplenomegaly.  No  bruits.  No masses.  EXTREMITIES:  Warm with no cyanosis, clubbing, or edema.  No rash.  NEURO:  Alert and oriented x3.  Cranial nerves II-XII are intact.  Moves  all 4 extremities without difficulty.  Affect is pleasant.   EKG shows sinus rhythm with a chronic left bundle-branch block at 73.   ASSESSMENT/PLAN:  1. Severe mitral regurgitation with left ventricular dysfunction and      ejection fraction about 30%.  She is pending mitral valve repair      with Dr. Cornelius Moras once her depression is improved.  She follows up with      Dr. Nolen Mu in Psychiatry next week.  She is on a good medical      regimen.  We will continue this.  Her volume status looks okay.   DISPOSITION:  We will see her back in about 6 weeks.     Bevelyn Buckles. Bensimhon, MD  Electronically  Signed    DRB/MedQ  DD: 01/14/2008  DT: 01/15/2008  Job #: [id number]

## 2010-11-06 NOTE — Assessment & Plan Note (Signed)
[facility]                            CARDIOLOGY OFFICE NOTE   FARRAN, AMSDEN                    MRN:          [id number]  DATE:05/25/2007                            DOB:          [birth year]    INTERVAL HISTORY:  Ms. Crafts is a fairly complicated 65 year old woman  with a history of gastroesophageal reflux disease, asthma,  hyperlipidemia, attention-deficit-disorder, and iron-deficiency anemia  thought secondary to AVMs, who I first saw several weeks ago in the  hospital with progressive shortness of breath.  An echocardiogram  revealed an ejection fraction of 25-35%.  There were several regional  wall motion abnormalities suggestive of LAD disease.  However, we took  her to catheterization and she had normal coronary arteries and an  ejection fraction of 35%.  Her right atrial pressure had a mean of 9.  Pulmonary artery pressure was 39/19 with a mean of 28.  Wedge pressure  was 22 with V waves in the 35 range.  Fick cardiac index was 2.4 liters  per minute.  There was also evidence of severe mitral regurgitation on  both her chest wall echo and cardiac catheterization.  She then  underwent TEE with Dr. Eden Emms which showed an EF of 35%.  The posterior  leaflet was restricted with moderate to moderate-to-severe mitral  regurgitation.  Dr. Fabio Bering thought was that this was primarily anular  dilatation.  A beta-blocker was added to her regimen and she was  discharged home.  Since going home, she feels much better.  She is starting to get her  energy back.  Her breathing is getting somewhat close to baseline and  she is actually able to walk around [place] now.  However, she says she  is not fully back.  She denies any lower extremity edema.  She has 2-  pillow orthopnea.  No chest pain.   CURRENT MEDICATIONS:  1. Zoloft 100 mg a day.  2. Protonix 40 b.i.d.  3. Coreg 6.25 b.i.d.  4. Aspirin 81.  5. Digoxin 0.25 a day.  6. Valsartan HCTZ  160/25 a day.  7. QVAR 2 puffs a day.   PHYSICAL EXAMINATION:  GENERAL:  She is somewhat fatigued-appearing but  no acute distress, ambulates around the clinic without any respiratory  difficulty.  VITAL SIGNS:  Blood pressure is 100/50, heart rate 64, weight is 150,  which is up 18 pounds from previous.  HEENT:  Normal.  NECK:  Supple.  There is no  JVD.  Carotids are 2 plus bilaterally  without any bruits.  There is no lymphadenopathy or thyromegaly.  CARDIAC:  Her PMI is minimally displaced.  She has a regular rate and  rhythm with a very soft systolic ejection murmur at the apex.  There is  no S3.  LUNGS:  Clear.  ABDOMEN:  Soft, nontender, nondistended.  No hepatosplenomegaly.  No  bruits.  No masses.  Good bowel sounds.  EXTREMITIES:  Warm with no cyanosis, clubbing, or edema.  No rash.  NEUROLOGIC:  Alert and oriented x3.  Cranial nerves II-XII are intact.  Moves all 4 extremities without difficulty.  Affect is mildly flat.   ASSESSMENT/PLAN:  This is somewhat of a difficult situation.  I have  reviewed both her chest wall echocardiogram and her transesophageal  echocardiogram.  I have also had Dr. Jens Som look at it.  From her  initial echocardiogram, I really suspected that she would have severe  coronary artery disease but obviously this is not the case.  The  question now remains whether her mitral valve is the primary problem and  her cardiomyopathy as a result of it or whether or not her  cardiomyopathy is idiopathic and just exacerbated her mitral  regurgitation.  Her left ventricular dimension is not overly distended,  her end systolic diameter being 45-mm with end diastolic diameter being  48-mm.  I had a long talk with both her sister and her niece about the  course.  I do think she has intrinsic mitral valve disease with her  posterior leaflet being quite restricted.  However, I am not sure if her  mitral regurgitation is made worse by her cardiomyopathy.  At  this  point, I think we should treat her for another couple of weeks and see  if she gets any reverse or mottling and improvement in her mitral  regurgitation.  I have a low threshold to have her evaluated for mitral  valve repair.  However, if her left ventricular function is recovering  and her mitral regurgitation looks better, we may be able to continue to  treat her medically.  We will get her echocardiogram next week and see  her back shortly after that to further evaluate.     Bevelyn Buckles. Bensimhon, MD  Electronically Signed    DRB/MedQ  DD: 05/25/2007  DT: 05/25/2007  Job #: [id number]   cc:   Charlaine Dalton. [name], MD, FCCP

## 2010-11-06 NOTE — Progress Notes (Signed)
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 Also with asthma and Parkinson's disease and Lewy-body dementia. Returns for routine f/u.  Echo in August 2010 which showed stable EF 45% with mild to moderate MR. LV dimensions stable.   Returns for routine f/u. Now at [facility]. Breathing is doing fairly well. Gets around with walker. Having some difficulty with LE edema - worse as day goes along. No PND or orthopnea. Continues to struggle with her dementia and mood.     ROS: All systems negative except as listed in HPI, PMH and Problem List.  Past Medical History  Diagnosis Date  . CHF (congestive heart failure)   . NICM (nonischemic cardiomyopathy)   . Mitral regurgitation     moderate - severe  . Depression   . Asthma   . Nephrolithiasis   . HTN (hypertension)   . HLD (hyperlipidemia)   . GERD (gastroesophageal reflux disease)   . Knee pain   . Anemia   . Dementia   . Glaucoma   . Osteoarthritis   . Hypothyroidism   . Colonic polyp   . Parkinson's disease     Current Outpatient Prescriptions  Medication Sig Dispense Refill  . albuterol (PROVENTIL HFA) 108 (90 BASE) MCG/ACT inhaler Inhale 2 puffs into the lungs every 6 (six) hours as needed.        . ALPRAZolam (XANAX) 1 MG tablet Take 0.5 mg by mouth 4 (four) times daily as needed.        Marland Kitchen aspirin 81 MG tablet Take 81 mg by mouth daily.        . benzonatate (TESSALON) 100 MG capsule Take 100 mg by mouth 3 (three) times daily as needed.        . Calcium Carbonate-Vitamin D 600-400 MG-UNIT per tablet Take 1 tablet by mouth 2 (two) times daily.        . carbidopa-levodopa (SINEMET) 10-100 MG per tablet Take 1.5 tablets by mouth 3 (three) times daily.        . carvedilol (COREG) 6.25 MG tablet Take 6.25 mg by mouth 2 (two) times daily with a meal.        . fluticasone (FLONASE) 50 MCG/ACT nasal spray 2 sprays by Nasal route daily.        .  hydrochlorothiazide 25 MG tablet Take 25 mg by mouth daily.        Marland Kitchen lamoTRIgine (LAMICTAL) 200 MG tablet Take 200 mg by mouth daily.        Marland Kitchen loratadine (CLARITIN) 10 MG tablet Take 10 mg by mouth daily.        . naproxen sodium (ANAPROX) 220 MG tablet Take 220 mg by mouth 2 (two) times daily with a meal.        . omeprazole (PRILOSEC) 40 MG capsule Take 40 mg by mouth daily.        . Prenatal Vit-Fe Psac Cmplx-FA (PRENATAL MULTIVITAMIN) 60-1 MG tablet Take 1 tablet by mouth daily with breakfast.        . QUEtiapine (SEROQUEL) 200 MG tablet Take 200 mg by mouth at bedtime.        Marland Kitchen QUEtiapine (SEROQUEL) 25 MG tablet Take 25 mg by mouth daily before breakfast.        . travoprost, benzalkonium, (TRAVATAN) 0.004 % ophthalmic solution 1 drop at bedtime.        . valsartan (DIOVAN) 160  MG tablet Take 160 mg by mouth 2 (two) times daily.           PHYSICAL EXAM: Filed Vitals:   11/06/10 1434  BP: 120/70  Pulse: 70   General:   She is in no acute distress, confused at times ambulates around the clinic slowly with walker without any respiratory distress. HEENT:  Normal. NECK:  Supple.  JVP 5.  Carotids are 2+ bilaterally without bruits.  There is no lymphadenopathy or thyromegaly. CARDIAC:  PMI is nondisplaced.  She has a regular rate and rhythm 2/6 MR murmur.  There is no S3. LUNGS:  Clear. ABDOMEN:  Soft, nontender and non-distended.  No hepatosplenomegaly.  No bruits.  No masses.  Good bowel sounds. EXTREMITIES:  Warm with no cyanosis, clubbing. 1+ edema.  No rash. NEUROLOGIC:  Alert and oriented x3.  Cranial nerves II-XII are intact. She moves all 4 extremities without difficulty.  Affect is improved. Not tearful   ECG: NSR 70 LBBB (chronic)   ASSESSMENT & PLAN:

## 2010-11-06 NOTE — Consult Note (Signed)
Lorraine [name], Lorraine [name] NO.:  [id number]   MEDICAL RECORD NO.:  [id number]          PATIENT TYPE:  INP   LOCATION:  2011                         FACILITY:  [facility]   PHYSICIAN:  Leighton Roach. Truett Perna, M.D. DATE OF BIRTH:  22-Apr-1946   DATE OF CONSULTATION:  05/05/2007  DATE OF DISCHARGE:  05/03/2007                                 CONSULTATION   REASON FOR CONSULT:  Anemia.   REFERRING PHYSICIAN:  Dr. Gala Romney.   HISTORY OF PRESENT ILLNESS:  Lorraine [name] is a pleasant 65 year old white  female asked to see for evaluation of anemia.  She was admitted on  May 01, 2007 with acute systolic congestive heart failure, noticed  to have severe anemia, at least dating back to September of 2008 by her  pulmonary physician.  At that time, she had presented with significant  shortness of breath and apparently labs were drawn, which required  further workup.  She had a GI evaluation, including a colonoscopy, which  per patient report, showed a AVMs, not sufficient to explain this  significant anemia.  According to the chart, the AVMs were located in  the cecum and in the small intestine.  The labs, since that time, had  been in the 8.0 and 9.0 for hemoglobin and 27.0 to 30.0 range for  hematocrit.  Her hemoccult is negative.  Her white count and platelet  count are normal.  Retic count as of May 01, 2007 is 72.5.  Her sed  rate is 7.  LDH is 430.  Her bilirubin is elevated at 3.0 (was 2.7 on  admission).  Her heptoglobin is less than 6.  Iron levels are low at 19,  with an elevated TIBC at 476 and percent saturation only 4.  Other labs  pending are ANA and HIV.  Currently, she is on Nu-Iron 150 mg daily.  No  family history blood dyscrasia.  Smear was reviewed by Dr. Truett Perna,  showing a normal white count, scattered large platelets.  Her red blood  cells shows significant polychromasia, hypochromia, microcytic.  Ovalocytes are present, as well as tear drops.  Fear target  cells.  Rare  cystoceles-helmet cells.  No spread of sites.  Based on these findings,  we were asked to see the patient with recommendations regarding her  care.   PAST MEDICAL HISTORY:  1. Acute systolic congestive heart failure.  2. History of arteriovenous malformation, followed by Dr. Elnoria Howard.  3. Iron deficiency anemia, per chart report.  4. Ejection fraction 25% to 35% per 2D echo.  5. Chronic asthma.  6. Gastroesophageal reflux disease.  7. Hypertension.  8. Hyperlipidemia.  9. Osteoarthritis.  10.Vocal cord dysfunction per chart report.  11.Severe mitral regurgitation - moderate to severe tricuspid      regurgitation, per chart.   PAST SURGICAL HISTORY:  1. Status post cholecystitis in the 1980s.  2. Status post ectopic pregnancy, remote.  3. Status post knee surgery in the 1990s.   ALLERGIES:  NO KNOWN DRUG ALLERGIES.   CURRENT MEDICATIONS:  Aspirin 81 mg daily, Lanoxin 0.5 mg daily,  Flovent, HCTZ, Avapro, Neu-Iron, Protonix, K-Dur,  Zoloft, Travatan,  Xanax, guaifenesin, __________ , milk of magnesia, Zofran and Ambien.   REVIEW OF SYSTEMS:  Remarkable for fatigue, 35 pound weight loss in  September, complaining about of anorexia the patient states the food  does not taste good, shortness of breath at rest and dyspnea on  exertion.  Minimal nonproductive cough.  No chest pain or palpitations.  Her main complaint, besides the respiratory symptoms, are what she  describes as memory problems.  She has trouble, at times, remembering  simple events of the recent past.  No blood in the stools.  Intermittent  edema in the lower extremities.  She has osteoarthritis in the knees,  calf and musculoskeletal pain.  No dysesthesias.   FAMILY HISTORY:  Mother died with breast cancer.  There is lung cancer  in the family.  One sister in good health.  No history of anemia in the  family.   SOCIAL HISTORY:  The patient is divorced, 2 children in good health.  Retired from an  Scientist, research (physical sciences).  Never smoked.  No alcohol history.  Lives in [place].   HEALTH MAINTENANCE:  Her last physical was many years ago.  She is up-to-  date with her mammograms.  According to her these are negative.  Her  last colonoscopy was in October of this month, with a diagnosis of AVMs.  Prior to that, she had a screening colonoscopy 2 years ago.   PHYSICAL EXAMINATION:  GENERAL:  This is a pale, 65 year old white  female in no acute distress.  Alert, responds to questions appropriately  at this time.  VITAL SIGNS:  Blood pressure 120/75.  Pulse 81.  Respirations 20.  Temperature 98.0.  Pulse oximetry 96% on room air.  Weight 74.7  kilograms.  Height 65 inches.  HEENT:  Normocephalic, atraumatic.  PERRLA.  Sclerae anicteric.  Oral  mucosa with questionable thrush, the tongue is very smooth at the tip.  No oral lesions.  NECK:  No cervical or supraclavicular masses.  LUNGS:  A few rales at the bases, but no rhonchi or wheezing.  No  axillary masses.  BREASTS:  Remarkable for some fibrogranular tissue in both breasts, but  no discrete mass has been found.  CARDIOVASCULAR:  Regular rate and rhythm with a 1/6 systolic murmur.  No  rubs or gallops.  ABDOMEN:  Soft, nontender.  Bowel sounds x4.  No palpable spleen or  liver.  GU/RECTAL:  Deferred.  EXTREMITIES:  No clubbing or cyanosis.  No edema.  SKIN:  Without lesions, bruising or petechial rash.  NEURO:  The patient followed commands, strength is equal bilaterally,  DTRs bilaterally equal.   LABORATORY DATA:  Hemoglobin 8.9, hematocrit 28.8, white count 8.6,  platelets 269, MCV 71.3, PT 21.4, PTT 30.0, INR 1.8, retic count 72.5.  LDH 430.  Sed rate 7.  TSH 1.584.  T4 1.2.  Heptoglobin less than 6.0.  Iron 19.  TIBC 476, percentage saturation 4.0.  Sodium 141, potassium  3.1, BUN 16, creatinine 0.78, glucose 111, total bilirubin 3.0, alkaline  phosphatase 41, AST 132, ALT 86, total protein 6.2, albumin 3.3, calcium  9.4.   D-dimer, in May 01, 2007, was 1.07.  Troponin negative.  Hemoccult negative.  CT of the chest, on May 01, 2007, was negative  for PE, but showed multiple mediastinal and hilar bilateral  lymphadenopathy of unknown etiology, along with numeral tiny parenchyma  lung nodules.  CT of the head, on May 01, 2007, with contrast, is  negative for  intracranial abnormalities.   IMPRESSION:  1. Microcytic anemia.  2. Anorexia - weight loss.  3. Congestive heart failure - mitral regurgitation.  4. Asthma.  5. Altered mental status.  6. Status post red blood cell transfusions, October of 2008.  7. History of arteriovenous malformation.  8. History of iron deficiency.  9. Small chest lymph nodes, lung nodules.   Peripheral blood smear and the hematologic indices are consistent with  iron deficiency.  There is increased bilirubin, LDH and low heptoglobin  raised a concern for hemolysis.  The negative DAT makes immune hemolysis  unlikely.  The question, however, is hemolysis related to ineffective  erythropoiesis, perhaps heart valve related versus medication related.  The chest nodes are tiny lung nodules and are most likely benign,  nonspecific findings.  No other clinical indications of a malignancy  aside from anorexia and weight loss.   RECOMMENDATIONS:  1. Change to ferrous sulfate q.i.d.  2. Check ferritin, B12, fractionated bilirubin.  3. Consider neurology evaluation of mental status if the B12 is      normal.  4. Transfuse red blood cells for symptomatic anemia as needed per      cardiology.   Thank you very much for allowing [place] the opportunity to participate in  the care of [name].  We will follow on the labs, and further  recommendations will follow depending on the results of these.      Marlowe Kays, P.A.      Leighton Roach. Truett Perna, M.D.  Electronically Signed    SW/MEDQ  D:  05/05/2007  T:  05/05/2007  Job:  [id number]   cc:   Jeannett Senior A. Clent Ridges, MD  Glorianne [place], M.D.

## 2010-11-09 NOTE — Assessment & Plan Note (Signed)
[facility]                             PULMONARY OFFICE NOTE   DELBERT, VU                    MRN:          [id number]  DATE:08/21/2006                            DOB:          April 19, 1946    SUBJECTIVE:  The patient is a 65 year old white female, a patient of Dr.  Casimiro Needle B. Wert, who has a known history of persistent cough, dyspnea  and hoarseness dating back to the summer of 2007.  The patient presents  today for a two-week followup for a medication review.  The patient  continues to have persistent symptoms, despite being placed on  aggressive reflux prevention, including Protonix twice daily and  metoclopramide four times a day and aggressive cough suppression with  Delsym and Tramadol.  The patient has brought her medicines in today.  It appears that she is only taking Protonix once a day, when it actually  was prescribed b.i.d., and is not taking Reglan.  She also is not using  Delsym but is using Tramadol.  The patient complains that she continues  to have some cough, hoarseness and wheezing intermittently.  The patient  denies any fever, chest pain, orthopnea, PND or leg swelling.   PAST MEDICAL HISTORY:  Is reviewed.   CURRENT MEDICATIONS:  Are reviewed.   PHYSICAL EXAMINATION:  GENERAL:  The patient is a pleasant female, in no  acute distress.  VITAL SIGNS:  She is afebrile with stable vital signs.  Oxygen  saturation is 98% on room air.  HEENT:  Nasal mucosa mildly erythematous.  Nontender sinuses.  Posterior  pharynx clear.  NECK:  Supple without adenopathy.  LUNGS:  Sounds are clear to auscultation without any wheezing or  crackles.  The patient does have some upper airway pseudo-wheezing.  HEART:  A regular rate and rhythm.  ABDOMEN:  Soft, nontender.  EXTREMITIES:  Warm, without any edema.   IMPRESSION/PLAN:  1. Chronic cough with suspected upper airway instability, possibly      secondary to reflux:  The patient  is recommended to increase      Protonix up to twice a day.  The patient was given samples to help      supplement with her daily prescription of Protonix.  The patient is      advised to take Protonix 30 minutes before meal time, along with      anti-reflux preventative measures.  The patient is also to restart      Reglan 10 mg q.i.d.  She is also advised to use an aggressive cough      suppression regimen with Delsym and Tramadol.  The patient is to      return back with Dr. Casimiro Needle B. Wert as scheduled next week, or      sooner if needed.  2. Complex medication regimen:  The patient's medications were      reviewed in detail.  The      patient is given a computerized medication calendar and reviewed in      detail with the patient.  The patient is required to bring this  back to each and every visit.      Rubye Oaks, NP  Electronically Signed      Charlaine Dalton. [name], MD, [facility]  Electronically Signed   TP/MedQ  DD: 08/21/2006  DT: 08/21/2006  Job #: [id number]

## 2010-11-09 NOTE — Assessment & Plan Note (Signed)
[facility]                             PULMONARY OFFICE NOTE   ELANDA, GARMANY                    MRN:          [id number]  DATE:06/02/2006                            DOB:          Jan 25, 1946    HISTORY OF PRESENT ILLNESS:  The patient is a 65 year old white female  patient of Dr. Thurston Hole, who returns for a 2-week followup.  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  The patient last visit was given  a steroid burst, Reglan was added to her current regimen 4 times a day,  and Protonix was increased up to twice a day.  The patient returns today  and reports she has had no improvement in symptoms.  Continues to have  persistent hoarseness and shortness of breath.  The patient has brought  all of her medications in today; however, she is extremely confused with  her medications and has several old bottles of medication that she is  not currently taking mixed in with her scheduled maintenance  medications.   PAST MEDICAL HISTORY:  Reviewed.   CURRENT MEDICATIONS:  Reviewed.   PHYSICAL EXAMINATION:  The patient is a female in no acute distress.  She is afebrile with stable vital signs.  HEENT:  Nasal mucosa is with some mild redness.  Nontender sinuses to  percussion.  NECK:  Supple without adenopathy.  LUNGS:  Lung sounds are clear to auscultation bilaterally.  CARDIAC:  Regular rate and rhythm.  ABDOMEN:  Soft and benign.  EXTREMITIES:  Warm without any edema.   IMPRESSION AND PLAN:  1. Persistent hoarseness and shortness of breath with suspected vocal      cord dysfunction syndrome, and patient with possible underlying      reflux.  The patient will be referred to ear, nose and throat, Dr.      Jearld Fenton, on June 30, 2006.  The patient will continue on her      current regimen and follow back up with Dr. [name] in approximately 1      month, right after her  appointment with ear, nose and throat.  2. Complex medication regimen.  The patient's medications were      reviewed in detail, patient      education was provided.  A computerized medication calendar was      completed for this patient and reviewed in detail.      Rubye Oaks, NP  Electronically Signed      Charlaine Dalton. [name], MD, [facility]  Electronically Signed   TP/MedQ  DD: 06/03/2006  DT: 06/04/2006  Job #: [id number]

## 2010-11-09 NOTE — Assessment & Plan Note (Signed)
[name]                             PULMONARY OFFICE NOTE   Lorraine [name], Lorraine [name]                    MRN:          [id number]  DATE:05/20/2006                            DOB:          March 03, 1946    HISTORY:  This is a 65 year old white female see at Dr. Augustin Schooling  request on April 09, 2006, for difficult-to-control asthma with  features I thought predominantly upper airway instability and  documentation of normal expiratory flow volume loop on the day she was  seen while having active symptoms.  I reasoned that she probably had  pseudoasthma, recommended elimination of all inhaled devices in favor of  a trial of Protonix 40 mg b.i.d. taken before meals.  She stopped Advair  at that point and says she noticed no difference at all in terms of  her symptoms (note that the symptoms did not crescendo off of Advair)  and continues to complain of excessive sensation of something stuck in  her throat associated with dyspnea which is no better.  However, she  has no nocturnal symptoms of cough or dyspnea or subjective wheeze.   I reviewed her medications with her and it turns out she actually did  not take the Protonix as recommended, using the second dose at bedtime  rather than before meals as reflected in the written instructions that I  actually kept a copy of and showed her here in the office.   PHYSICAL EXAMINATION:  GENERAL:  She is a somewhat depressed, anxious-  appearing white female in no acute distress.  She has stable vital  signs.  HEENT:  Unremarkable, pharynx clear.  LUNG FIELDS:  Perfectly clear bilaterally to auscultation and  percussion.  She does clear her throat almost constantly during the  interview and exam, however.  HEART:  There is a regular rate and rhythm without murmur, gallop or  rub.  ABDOMEN:  Soft, benign.  EXTREMITIES:  Warm without calf tenderness, cyanosis, clubbing, or  edema.   IMPRESSION:  Classic vocal  cord dysfunction syndrome, probably  associated with reflux but may be functional in nature (note she is on  multiple psychotropics).  She does report, however, that short courses  of prednisone had improved her symptoms previously so I am going to  recommend we rechallenge her with another round of prednisone 10 mg  tablets to be tapered off over 6 days and add Reglan 10 mg before meals  to Protonix 40 mg b.i.d. before meals to see if maximum treatment  directed at reflux does not eliminate the problem.  If not, ENT  evaluation would be the next logical step.   I think she would also benefit from full medication reconciliation and  generation of medication calendar as I notice she is struggling to  follow instructions.  I have offered the services of our  nurse practitioner for this purpose in 10 days and ENT evaluation can be  considered at that time if not improving on this regimen.     Charlaine Dalton. [name], MD, [facility]  Electronically Signed    MBW/MedQ  DD: 05/20/2006  DT:  05/21/2006  Job #: [id number]   cc:   Gabriel Earing, M.D.

## 2010-11-09 NOTE — Assessment & Plan Note (Signed)
[facility]                             PULMONARY OFFICE NOTE   MYLAN, LENGYEL                    MRN:          [id number]  DATE:08/07/2006                            DOB:          1946/05/24    HISTORY:  A 65 year old white female never smoker with paroxysms of  dyspnea and hoarseness dating back to the summer of 2007 for which I saw  her on November 27 and recommended a treatment directed at both GERD,  rhinitis, and asthma.  It is not clear that she did anything I asked her  to because when she returned for her medication reconciliation on  December 10 she reports she was no better but our nurse practitioner  could not confirm that she actually took the medicines I prescribed.  She comes back today stating she is again no better.  She is seen by  Dr. Jearld Fenton who she says reported no problems but has been bothered by  paroxysms of dyspnea associated with profound hoarseness that are not  directly related to activity.  They do not typically occur at night.  They are somewhat better when she uses albuterol up to four times daily.  She denies any excessive sputum production, fevers, chills, sweats,  orthopnea, PND, chest pain or leg swelling.  Interestingly, she does not  have significant reproducible dyspnea on exertion.   PHYSICAL EXAMINATION:  GENERAL:  She is an ambulatory white female with  classic voice fatigue.  She is afebrile with normal vital signs.  HEENT:  Unremarkable.  Oropharynx is clear.  There is no evidence of  excessive post nasal drainage or cobblestoning.  NECK:  Supple without cervical adenopathy or tenderness.  Trachea is  midline, no thyromegaly.  LUNG FIELDS:  Perfectly clear bilaterally to auscultation and percussion  within 4 hours of her last albuterol dose.   Spirometry was obtained during symptoms previously and does not show any  evidence of definite airflow obstruction.   IMPRESSION:  I believe this patient  predominantly has pseudoasthma from  either reflux or a functional component.  The fact that she got no  better from treatment directed at reflux and also a short course of  prednisone (if she even took the medicine) would be strong evidence  against asthma, although we have not excluded it for sure yet.   1. I therefore recommended as a first step full medication      reconciliation and I told her I could not continue to treat her      here unless I had a full understanding of exactly what she is      taking at home, and I have offered the services of our nurse      practitioner for this purpose within the next 2 weeks.  2. In addition, I am going to go ahead and schedule a methacholine      challenge test and asked the patient not to use the albuterol when      she comes in so we can prove once and for all is there asthma      present and  if so, does it correlate with the symptoms she is      experiencing.  3. If she continues to have profound hoarseness there is no followup      needed here but rather      referred her back to Dr. Jearld Fenton for this issue.  She may benefit      from being referred on to the [facility] if Dr. Jearld Fenton      agrees.     Charlaine Dalton. [name], MD, [facility]  Electronically Signed    MBW/MedQ  DD: 08/07/2006  DT: 08/07/2006  Job #: [id number]   cc:   Gabriel Earing, M.D.  Suzanna Obey, M.D.

## 2010-11-09 NOTE — Assessment & Plan Note (Signed)
[facility]                               PULMONARY OFFICE NOTE   JAMERICA, SNAVELY                    MRN:          [id number]  DATE:04/09/2006                            DOB:          1945-12-06    PULMONARY CONSULTATION:   REASON FOR CONSULTATION:  Dyspnea.   HISTORY:  A 65 year old white female who states she had asthma as a child  but outgrew it in puberty.  Within the past year she has noticed increasing  symptoms of dyspnea associated with hoarseness but not with typical  allergic features such as itching and sneezing.  She states she gets  better with high doses of prednisone but not low doses and only improves  with albuterol, has not improved with Advair.  She denies any associated  nocturnal cough of exacerbation.  No fevers, chills, sweats, orthopnea, PND  or leg swelling.   No chest pain of any kind.   PAST MEDICAL HISTORY:  1. Hyperlipidemia.  2. Asthma as a child, as noted.  3. Cholecystectomy in 1980.  4. She also has arthritis in her knees.   ALLERGIES:  None known.   MEDICATIONS:  Medications reviewed in detail on the worksheet.  Significant  for multiple psychotropics.  See column of April 09, 2006, for details.  Note that Protonix is listed but she is not actually taking it.  She is  taking Advair, however, at a dose of 250/50 mcg one b.i.d.   SOCIAL HISTORY:  She has never smoked.  Has worked in Audiological [employer].  No  unusual travel, pet or hobby exposure.   FAMILY HISTORY:  Significant for the fact of positive emphysema in smokers  only and positive for lung cancer in her mother.   REVIEW OF SYSTEMS:  Taken in detail on the worksheet and significant for  overt heartburn symptoms, for which she previously took Nexium and was upset  when her insurance company told her she could not have Nexium but for some  reason has not started Protonix yet.   PHYSICAL EXAMINATION:  GENERAL:  This is anxious obese white female  in no  acute distress.  VITAL SIGNS:  Stable vital signs.  HEENT:  Significant for a mint in her mouth.  Dentition is intact.  Nasal  turbinates normal.  Ear canals clear bilaterally.  NECK:  Supple without cervical adenopathy or tenderness.  Trachea is  midline.  No thyromegaly.  CHEST:  The lung fields reveal classic pseudo-wheeze.  CARDIAC:  There is a regular rhythm without murmur, gallop, or rub.  ABDOMEN:  Soft, benign.  EXTREMITIES:  Warm without calf tenderness, cyanosis, clubbing or edema.   PFTs today were normal but inspiratory loop was not available.   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  The response to  prednisone is somewhat paradoxic but probably is because she does indeed  have true asthma that is an inflammatory component for which reflux is  fueling the fire.   To test this hypothesis, I recommended initiation of Protonix 40  mg b.i.d.  perfectly regularly and I emphasized dietary compliance, which includes the  avoidance of mint and menthol lozenges.   She could certainly use albuterol two puffs q.4h. p.r.n., but I have asked  her to stop Advair, since it may be irritating the upper airway more than it  is helping the lower airway, and follow up here in 4 weeks, sooner if  needed.            ______________________________  Charlaine Dalton [name], MD, [facility]      MBW/MedQ  DD:  04/09/2006  DT:  04/11/2006  Job #:  [id number]   cc:   Gabriel Earing, M.D.

## 2010-11-09 NOTE — Assessment & Plan Note (Signed)
[facility]                             PULMONARY OFFICE NOTE   Lorraine [name], Lorraine [name]                    MRN:          [id number]  DATE:09/23/2006                            DOB:          01/07/1946    HISTORY:  A 65 year old white female, never smoker, with chronic cough,  dyspnea, and hoarseness that have all completely resolved now since Qvar  was started at a dose of 40 two puffs b.i.d. after failing to totally  eradicate her symptoms with aggressive treatment directed at GERD.  She  no longer needs any ProAir and has no nighttime or daytime symptoms of  any cough or dyspnea.   MEDICATIONS:  Reviewed with the patient in detail using the medication  calendar format, see column dated September 23, 2006 which correlates 100%  with her maintenance medicines and note that she is no longer needing  p.r.n. ProAir.   PHYSICAL EXAMINATION:  She is a pleasant, ambulatory, white female in no  acute distress.  VITAL SIGNS:  Stable.  HEENT:  Unremarkable, oropharynx clear.  LUNGS:  Fields completely clear bilaterally to auscultation and  percussion.  Regular rhythm without murmur, gallop, rub.  ABDOMEN:  Soft, benign.  EXTREMITIES:  Warm without calf tenderness, cyanosis, clubbing, edema.   MDI techniques reviewed and now she is at a baseline of 50%  effectiveness which improves to 75% after coaching.   IMPRESSION:  Marked improvement in symptoms with initiation of Qvar  after first treating reflux aggressively.  This which exacerbated asthma  is the primary problem, and the reflux is the secondary.  That is,  asthma caused cough caused reflux, and treating the reflux did not  totally eliminate the cough.   Using a reverse therapeutic trial approach, therefore I am going to  ask her to stop the metoclopramide first, the Protonix second after 2  weeks off of the metoclopramide to see if the cough flares.  If so, she  would appear to have 2 primary  processes (which is not uncommon in  patients with chronic cough).   If she does well on the Qvar with 40 two puffs b.i.d. consistently, I  would like to see her back in a year for refill; alternatively, Dr.  Smith Mince can refill her Qvar.     Charlaine Dalton. [name], MD, [facility]  Electronically Signed   MBW/MedQ  DD: 09/23/2006  DT: 09/23/2006  Job #: [id number]   cc:   Gabriel Earing, M.D.

## 2010-11-09 NOTE — Assessment & Plan Note (Signed)
[facility]                             PULMONARY OFFICE NOTE   ALYSIAH, SUPPA                    MRN:          [id number]  DATE:09/01/2006                            DOB:          [birth year]    PULMONARY/EXTENDED FOLLOWUP OFFICE VISIT:   HISTORY OF PRESENT ILLNESS:  A 65 year old white female, never smoker,  with paroxysms of dyspnea and hoarseness dating back to the summer of  2007, now maintained on PPI therapy and promotility agents.  For full  inventory of dosing please see face sheet of September 01, 2006.  She  returns after undergoing a methacholine challenge test that was positive  for asthma and reproduced most of her symptoms.  She has been suing her  ProAir up to two times daily, but rarely needing it at night to control  symptoms of dyspnea more than cough, the later symptoms of which seems  to have been much better once we treated the reflux component  aggressively.  She denies requiring any of the other p.r.n.'s including  Delsym and tramadol.   PHYSICAL EXAMINATION:  GENERAL:  She is a pleasant, ambulatory white  female in no acute distress.  VITAL SIGNS:  Stable.  HEENT:  Unremarkable.  Pharynx is clear.  LUNGS:  Lung fields clear bilaterally to auscultation and percussion.  HEART:  Regular rhythm without murmurs, gallops, rubs.  ABDOMEN:  Soft, benign.  EXTREMITIES:  Warm without calf tenderness, cyanosis, clubbing or edema.   STUDIES:  MDI technique was reviewed and it was only 30% at baseline but  improved to 60% with coaching.   IMPRESSION:  This patient has relatively mild asthma that she can  control with p.r.n. use of Albuterol up to two times daily, despite poor  MDI technique, and has most of her problems after supper, again  suggesting a possibility of a reflex mechanism, stating she is taking  the PPI and Reglan exactly as directed.  I am going to take advantage of  this by asking her to use Qvar 40 two puffs  b.i.d. perfectly regularly  for the next three weeks and then return to see if the cough has been  eliminated.  If not, the most likely mechanism is that she is not using  it regularly or effectively.  I told her the goal of therapy is no  symptoms and no need for rescue therapy.   FOLLOWUP:  Follow up will be in three weeks, sooner if needed.     Charlaine Dalton. [name], MD, [facility]  Electronically Signed    MBW/MedQ  DD: 09/01/2006  DT: 09/02/2006  Job #: [id number]

## 2010-11-21 ENCOUNTER — Emergency Department (HOSPITAL_BASED_OUTPATIENT_CLINIC_OR_DEPARTMENT_OTHER)
Admission: EM | Admit: 2010-11-21 | Discharge: 2010-11-22 | Disposition: A | Discharge status: Home / Self Care | Payer: Medicare PPO | Attending: Emergency Medicine | Admitting: Emergency Medicine

## 2010-11-21 ENCOUNTER — Emergency Department (INDEPENDENT_AMBULATORY_CARE_PROVIDER_SITE_OTHER): Payer: Medicare PPO

## 2010-11-21 DIAGNOSIS — I509 Heart failure, unspecified: Secondary | ICD-10-CM | POA: Insufficient documentation

## 2010-11-21 DIAGNOSIS — E7800 Pure hypercholesterolemia, unspecified: Secondary | ICD-10-CM | POA: Insufficient documentation

## 2010-11-21 DIAGNOSIS — Y9210 Unspecified residential institution as the place of occurrence of the external cause: Secondary | ICD-10-CM | POA: Insufficient documentation

## 2010-11-21 DIAGNOSIS — W1809XA Striking against other object with subsequent fall, initial encounter: Secondary | ICD-10-CM | POA: Insufficient documentation

## 2010-11-21 DIAGNOSIS — W19XXXA Unspecified fall, initial encounter: Secondary | ICD-10-CM

## 2010-11-21 DIAGNOSIS — G309 Alzheimer's disease, unspecified: Secondary | ICD-10-CM | POA: Insufficient documentation

## 2010-11-21 DIAGNOSIS — G319 Degenerative disease of nervous system, unspecified: Secondary | ICD-10-CM | POA: Insufficient documentation

## 2010-11-21 DIAGNOSIS — Z79899 Other long term (current) drug therapy: Secondary | ICD-10-CM | POA: Insufficient documentation

## 2010-11-21 DIAGNOSIS — S0100XA Unspecified open wound of scalp, initial encounter: Secondary | ICD-10-CM | POA: Insufficient documentation

## 2010-11-21 DIAGNOSIS — F0280 Dementia in other diseases classified elsewhere without behavioral disturbance: Secondary | ICD-10-CM | POA: Insufficient documentation

## 2010-11-22 ENCOUNTER — Encounter: Payer: Self-pay | Admitting: Internal Medicine

## 2010-11-22 ENCOUNTER — Other Ambulatory Visit: Payer: Self-pay | Admitting: Internal Medicine

## 2010-11-22 ENCOUNTER — Ambulatory Visit (INDEPENDENT_AMBULATORY_CARE_PROVIDER_SITE_OTHER): Payer: 59 | Admitting: Internal Medicine

## 2010-11-22 VITALS — BP 100/60 | Temp 97.7°F | Wt 151.0 lb

## 2010-11-22 DIAGNOSIS — R197 Diarrhea, unspecified: Secondary | ICD-10-CM

## 2010-11-22 DIAGNOSIS — I10 Essential (primary) hypertension: Secondary | ICD-10-CM

## 2010-11-22 DIAGNOSIS — E871 Hypo-osmolality and hyponatremia: Secondary | ICD-10-CM

## 2010-11-22 LAB — CBC WITH DIFFERENTIAL/PLATELET
Basophils Absolute: 0 10*3/uL (ref 0.0–0.1)
Eosinophils Absolute: 0.3 10*3/uL (ref 0.0–0.7)
Eosinophils Relative: 5.9 % — ABNORMAL HIGH (ref 0.0–5.0)
HCT: 33.2 % — ABNORMAL LOW (ref 36.0–46.0)
Lymphs Abs: 0.7 10*3/uL (ref 0.7–4.0)
MCHC: 33.6 g/dL (ref 30.0–36.0)
MCV: 92.8 fl (ref 78.0–100.0)
Monocytes Absolute: 0.6 10*3/uL (ref 0.1–1.0)
Neutrophils Relative %: 67.2 % (ref 43.0–77.0)
Platelets: 216 10*3/uL (ref 150.0–400.0)
RDW: 13.2 % (ref 11.5–14.6)

## 2010-11-22 LAB — BASIC METABOLIC PANEL
Calcium: 9.5 mg/dL (ref 8.4–10.5)
GFR: 87.19 mL/min (ref >60.00)
Glucose, Bld: 84 mg/dL (ref 70–99)
Potassium: 4.1 mEq/L (ref 3.5–5.1)
Sodium: 127 mEq/L — ABNORMAL LOW (ref 135–145)

## 2010-11-22 MED ORDER — DIPHENOXYLATE-ATROPINE 2.5-0.025 MG PO TABS: ORAL_TABLET | ORAL | Status: DC

## 2010-11-22 NOTE — Assessment & Plan Note (Signed)
Chronic loose stools. Obtain CBC, chem7. Begin stool studies with c. Diff toxin, O&P and culture. Lomotil prn sparingly for severe loose stools and short term use. Followup if no improvement or worsening.

## 2010-11-22 NOTE — Assessment & Plan Note (Signed)
Low nl BP. Possible mild volume depletion with chronic loose stools. Obtain chem7. Hold hctz x 4 days.

## 2010-11-22 NOTE — Progress Notes (Signed)
  Subjective:    Patient ID: Lorraine [name], female    DOB: [birth year], 65 y.o.   MRN: [id number]  HPI Pt presents to clinic for evaluation of possible diarrhea. Notes loose poorly formed stools for over one month with intermittent fecal urgency. Denies f/c, abd pain, alternating constipation, or blood in stool. No recent abx use or changes in medications. Family member notes chronic difficulty with balance with intermittent falls and wonders about relationship to BP. No presyncope, dizziness or syncope. Using imodium prn with suboptimal results. No alleviating or exacerbating factors.  No other complaints.  Reviewed pmh, medications and allergies    Review of Systems see hpi     Objective:   Physical Exam  Nursing note and vitals reviewed. Constitutional: She appears well-developed and well-nourished. No distress.  HENT:  Head: Normocephalic and atraumatic.  Right Ear: External ear normal.  Left Ear: External ear normal.  Nose: Nose normal.  Eyes: Conjunctivae are normal. No scleral icterus.  Neck: No JVD present.  Abdominal: Soft. Bowel sounds are normal. She exhibits no distension and no mass. There is no tenderness. There is no rebound and no guarding.  Neurological: She is alert.       Walks with walker  Skin: Skin is warm and dry. No rash noted. She is not diaphoretic. No erythema.  Psychiatric: She has a normal mood and affect.          Assessment & Plan:

## 2010-11-23 ENCOUNTER — Telehealth: Payer: Self-pay

## 2010-11-23 NOTE — Telephone Encounter (Signed)
Message copied by Beverely Low on Fri Nov 23, 2010  4:02 PM ------      Message from: Staci Righter      Created: Thu Nov 22, 2010  9:45 PM       Sodium level is definitely low. Currently holding hctz for 4 days. pls have her return to lab on Monday for repeat chem7. If becomes weak, confused or lethargic present to ED.

## 2010-11-23 NOTE — Telephone Encounter (Signed)
Left message for pt to call back  °

## 2010-11-26 ENCOUNTER — Telehealth: Payer: Self-pay

## 2010-11-26 NOTE — Telephone Encounter (Signed)
Left message for pt to call back  °

## 2010-11-26 NOTE — Telephone Encounter (Signed)
Message copied by Beverely Low on Mon Nov 26, 2010  4:51 PM ------      Message from: Staci Righter      Created: Thu Nov 22, 2010  9:45 PM       Sodium level is definitely low. Currently holding hctz for 4 days. pls have her return to lab on Monday for repeat chem7. If becomes weak, confused or lethargic present to ED.

## 2010-11-27 ENCOUNTER — Telehealth: Payer: Self-pay

## 2010-11-27 NOTE — Telephone Encounter (Signed)
Pt's sister came in to offc to drop off stool samples and was notified of lab review a that time

## 2010-11-27 NOTE — Telephone Encounter (Signed)
Message copied by Beverely Low on Tue Nov 27, 2010  3:03 PM ------      Message from: Staci Righter      Created: Thu Nov 22, 2010  9:45 PM       Sodium level is definitely low. Currently holding hctz for 4 days. pls have her return to lab on Monday for repeat chem7. If becomes weak, confused or lethargic present to ED.

## 2010-11-28 ENCOUNTER — Emergency Department (HOSPITAL_BASED_OUTPATIENT_CLINIC_OR_DEPARTMENT_OTHER): Payer: 59

## 2010-11-28 ENCOUNTER — Emergency Department (INDEPENDENT_AMBULATORY_CARE_PROVIDER_SITE_OTHER): Payer: 59

## 2010-11-28 ENCOUNTER — Emergency Department (HOSPITAL_BASED_OUTPATIENT_CLINIC_OR_DEPARTMENT_OTHER)
Admission: EM | Admit: 2010-11-28 | Discharge: 2010-11-28 | Disposition: A | Discharge status: Home / Self Care | Payer: 59 | Attending: Emergency Medicine | Admitting: Emergency Medicine

## 2010-11-28 DIAGNOSIS — R6884 Jaw pain: Secondary | ICD-10-CM

## 2010-11-28 DIAGNOSIS — W19XXXA Unspecified fall, initial encounter: Secondary | ICD-10-CM

## 2010-11-28 DIAGNOSIS — R51 Headache: Secondary | ICD-10-CM | POA: Insufficient documentation

## 2010-11-28 DIAGNOSIS — G20A1 Parkinson's disease without dyskinesia, without mention of fluctuations: Secondary | ICD-10-CM | POA: Insufficient documentation

## 2010-11-28 DIAGNOSIS — M549 Dorsalgia, unspecified: Secondary | ICD-10-CM | POA: Insufficient documentation

## 2010-11-28 DIAGNOSIS — G20 Parkinson's disease: Secondary | ICD-10-CM | POA: Insufficient documentation

## 2010-11-28 DIAGNOSIS — M546 Pain in thoracic spine: Secondary | ICD-10-CM

## 2010-11-28 DIAGNOSIS — Y9210 Unspecified residential institution as the place of occurrence of the external cause: Secondary | ICD-10-CM | POA: Insufficient documentation

## 2010-11-28 DIAGNOSIS — Z79899 Other long term (current) drug therapy: Secondary | ICD-10-CM | POA: Insufficient documentation

## 2010-11-28 DIAGNOSIS — M47814 Spondylosis without myelopathy or radiculopathy, thoracic region: Secondary | ICD-10-CM

## 2010-12-01 LAB — STOOL CULTURE

## 2010-12-03 ENCOUNTER — Other Ambulatory Visit (INDEPENDENT_AMBULATORY_CARE_PROVIDER_SITE_OTHER): Payer: 59

## 2010-12-03 ENCOUNTER — Telehealth: Payer: Self-pay | Admitting: Family Medicine

## 2010-12-03 DIAGNOSIS — E871 Hypo-osmolality and hyponatremia: Secondary | ICD-10-CM

## 2010-12-03 LAB — BASIC METABOLIC PANEL
BUN: 13 mg/dL (ref 6–23)
Calcium: 9.3 mg/dL (ref 8.4–10.5)
Creatinine, Ser: 0.8 mg/dL (ref 0.4–1.2)
GFR: 77.08 mL/min (ref >60.00)
Glucose, Bld: 82 mg/dL (ref 70–99)
Potassium: 4.4 mEq/L (ref 3.5–5.1)

## 2010-12-03 NOTE — Telephone Encounter (Signed)
Patient lives in a memory care facility and needs over the counter medications written as a script so they can be dispensed when needed.  Sister is asking for a script for Immodium,apercreme and Aleve.  Please contact sister Lorraine [name]) with any questions.

## 2010-12-04 ENCOUNTER — Telehealth: Payer: Self-pay

## 2010-12-04 NOTE — Telephone Encounter (Signed)
Left message to return call 

## 2010-12-04 NOTE — Telephone Encounter (Signed)
Message copied by Beverely Low on Tue Dec 04, 2010  4:55 PM ------      Message from: Staci Righter      Created: Mon Dec 03, 2010  7:46 PM       Sodium still low. If not having increasing shortness of breath and swelling then continue to hold hctz. Schedule chem7 (hyponatremia) and BNP (chf) in 10d. Stool tests negative except one the outside lab is re-doing.

## 2010-12-05 ENCOUNTER — Other Ambulatory Visit: Payer: Self-pay | Admitting: Internal Medicine

## 2010-12-05 ENCOUNTER — Telehealth: Payer: Self-pay

## 2010-12-05 DIAGNOSIS — E871 Hypo-osmolality and hyponatremia: Secondary | ICD-10-CM

## 2010-12-05 DIAGNOSIS — I509 Heart failure, unspecified: Secondary | ICD-10-CM

## 2010-12-05 NOTE — Telephone Encounter (Signed)
Message copied by Beverely Low on Wed Dec 05, 2010  2:15 PM ------      Message from: Staci Righter      Created: Mon Dec 03, 2010  7:46 PM       Sodium still low. If not having increasing shortness of breath and swelling then continue to hold hctz. Schedule chem7 (hyponatremia) and BNP (chf) in 10d. Stool tests negative except one the outside lab is re-doing.

## 2010-12-05 NOTE — Telephone Encounter (Signed)
Pt's sister, Marjean Donna, notified and appointment made

## 2010-12-10 LAB — CLOSTRIDIUM DIFFICILE TOXIN

## 2010-12-12 ENCOUNTER — Telehealth: Payer: Self-pay

## 2010-12-12 ENCOUNTER — Telehealth: Payer: Self-pay | Admitting: Family Medicine

## 2010-12-12 ENCOUNTER — Emergency Department (HOSPITAL_BASED_OUTPATIENT_CLINIC_OR_DEPARTMENT_OTHER)
Admission: EM | Admit: 2010-12-12 | Discharge: 2010-12-12 | Disposition: A | Discharge status: Home / Self Care | Payer: Medicare PPO | Attending: Emergency Medicine | Admitting: Emergency Medicine

## 2010-12-12 DIAGNOSIS — Z79899 Other long term (current) drug therapy: Secondary | ICD-10-CM | POA: Insufficient documentation

## 2010-12-12 DIAGNOSIS — S0003XA Contusion of scalp, initial encounter: Secondary | ICD-10-CM | POA: Insufficient documentation

## 2010-12-12 DIAGNOSIS — W1809XA Striking against other object with subsequent fall, initial encounter: Secondary | ICD-10-CM | POA: Insufficient documentation

## 2010-12-12 DIAGNOSIS — Y9210 Unspecified residential institution as the place of occurrence of the external cause: Secondary | ICD-10-CM | POA: Insufficient documentation

## 2010-12-12 DIAGNOSIS — F0280 Dementia in other diseases classified elsewhere without behavioral disturbance: Secondary | ICD-10-CM | POA: Insufficient documentation

## 2010-12-12 DIAGNOSIS — Z9181 History of falling: Secondary | ICD-10-CM | POA: Insufficient documentation

## 2010-12-12 DIAGNOSIS — S1093XA Contusion of unspecified part of neck, initial encounter: Secondary | ICD-10-CM | POA: Insufficient documentation

## 2010-12-12 NOTE — Telephone Encounter (Signed)
Home health calling regarding a "severe drug interaction" for this patient. Please call her back.

## 2010-12-12 NOTE — Telephone Encounter (Signed)
Message copied by Beverely Low on Wed Dec 12, 2010  3:09 PM ------      Message from: Staci Righter      Created: Tue Dec 11, 2010  5:06 PM       All stool studies were negative

## 2010-12-12 NOTE — Telephone Encounter (Signed)
Pt's sister, Marjean Donna, notified

## 2010-12-12 NOTE — Telephone Encounter (Signed)
Harriett Sine from [facility] was calling to inform [name] of a severe drug reaction warning with the patient's Sinemet, Olanzapine, and prenatal vitamins.  She is also going to fax over information.  This is just a FYI or to see if the medication needs to be changed.

## 2010-12-14 NOTE — Telephone Encounter (Signed)
I do not prescribe any of these medications. Dr. Emerson Monte is her psychiatrist, and Dr. Louanna Raw at [facility] is her neurologist. They need to send this message to them

## 2010-12-14 NOTE — Telephone Encounter (Signed)
Spoke with Harriett Sine and she is aware that this information needs to be sent to Dr. Nolen Mu and Dr. Wynona Neat.

## 2010-12-17 ENCOUNTER — Other Ambulatory Visit (INDEPENDENT_AMBULATORY_CARE_PROVIDER_SITE_OTHER): Payer: 59

## 2010-12-17 DIAGNOSIS — I509 Heart failure, unspecified: Secondary | ICD-10-CM

## 2010-12-17 DIAGNOSIS — E871 Hypo-osmolality and hyponatremia: Secondary | ICD-10-CM

## 2010-12-17 LAB — BASIC METABOLIC PANEL
BUN: 11 mg/dL (ref 6–23)
Calcium: 9.3 mg/dL (ref 8.4–10.5)
Chloride: 90 mEq/L — ABNORMAL LOW (ref 96–112)
Creatinine, Ser: 0.8 mg/dL (ref 0.4–1.2)

## 2010-12-17 LAB — BRAIN NATRIURETIC PEPTIDE: Pro B Natriuretic peptide (BNP): 39 pg/mL (ref 0.0–100.0)

## 2010-12-18 ENCOUNTER — Telehealth: Payer: Self-pay

## 2010-12-18 DIAGNOSIS — F0281 Dementia in other diseases classified elsewhere with behavioral disturbance: Secondary | ICD-10-CM

## 2010-12-18 DIAGNOSIS — G20 Parkinson's disease: Secondary | ICD-10-CM

## 2010-12-18 DIAGNOSIS — I509 Heart failure, unspecified: Secondary | ICD-10-CM

## 2010-12-18 DIAGNOSIS — F0280 Dementia in other diseases classified elsewhere without behavioral disturbance: Secondary | ICD-10-CM

## 2010-12-18 DIAGNOSIS — G3183 Dementia with Lewy bodies: Secondary | ICD-10-CM

## 2010-12-18 NOTE — Telephone Encounter (Signed)
Pt has appt with Dr. Clent Ridges tomorrow, 12/19/10

## 2010-12-19 ENCOUNTER — Ambulatory Visit (INDEPENDENT_AMBULATORY_CARE_PROVIDER_SITE_OTHER): Payer: 59 | Admitting: Family Medicine

## 2010-12-19 ENCOUNTER — Encounter: Payer: Self-pay | Admitting: Family Medicine

## 2010-12-19 VITALS — BP 118/70 | HR 78 | Temp 97.4°F | Resp 16 | Wt 153.0 lb

## 2010-12-19 DIAGNOSIS — R197 Diarrhea, unspecified: Secondary | ICD-10-CM

## 2010-12-19 DIAGNOSIS — R5381 Other malaise: Secondary | ICD-10-CM

## 2010-12-19 DIAGNOSIS — R5383 Other fatigue: Secondary | ICD-10-CM

## 2010-12-19 DIAGNOSIS — E871 Hypo-osmolality and hyponatremia: Secondary | ICD-10-CM

## 2010-12-19 DIAGNOSIS — R531 Weakness: Secondary | ICD-10-CM

## 2010-12-19 MED ORDER — METRONIDAZOLE 500 MG PO TABS: 500.0000 mg | ORAL_TABLET | Freq: Three times a day (TID) | ORAL | Status: AC

## 2010-12-19 NOTE — Progress Notes (Signed)
  Subjective:    Patient ID: Lorraine [name], female    DOB: 07-26-1945, 65 y.o.   MRN: [id number]  HPI Here to follow up a 6 week history of watery diarrhea. She was seen here last month for this, and her workup was inconclusive. She tested negative for C. Difficile toxin, for ova and parasites, and for stool cultures. Her WBC count was normal. Her sodium was low at 125, chlorine low at 90, and potassium normal at 4.2. Told to drink fluids and to use Lomotil prn . She has continued to have watery stools with urgency and fecal incontinence since then. No pain or cramps, no nausea, no fevers.    Review of Systems  Respiratory: Negative.   Cardiovascular: Negative.   Gastrointestinal: Positive for diarrhea. Negative for nausea, vomiting, abdominal pain, constipation, blood in stool and abdominal distention.  Genitourinary: Negative.   Neurological: Positive for weakness and light-headedness.       Objective:   Physical Exam  Constitutional: She appears well-developed and well-nourished.  Neck: No thyromegaly present.  Cardiovascular: Normal rate, regular rhythm, normal heart sounds and intact distal pulses.   Pulmonary/Chest: Effort normal and breath sounds normal. No respiratory distress. She has no wheezes. She has no rales.  Abdominal: Soft. Bowel sounds are normal. She exhibits no distension and no mass. There is no tenderness. There is no rebound and no guarding.  Lymphadenopathy:    She has no cervical adenopathy.  Neurological: She is alert.          Assessment & Plan:  Diarrhea of uncertain etiology. Medication effects or colitis are possibilities. Treat empirically with Flagyl. Drink fluids and do not restrict sodium in the diet. We will refer her to Dr. Elnoria Howard, her GI specialist, ASAP.

## 2011-01-04 ENCOUNTER — Other Ambulatory Visit: Payer: Self-pay | Admitting: Gastroenterology

## 2011-01-04 ENCOUNTER — Ambulatory Visit (HOSPITAL_COMMUNITY)
Admission: RE | Admit: 2011-01-04 | Discharge: 2011-01-04 | Disposition: A | Discharge status: Home / Self Care | Payer: Medicare PPO | Source: Ambulatory Visit | Attending: Gastroenterology | Admitting: Gastroenterology

## 2011-01-04 DIAGNOSIS — Z79899 Other long term (current) drug therapy: Secondary | ICD-10-CM | POA: Insufficient documentation

## 2011-01-04 DIAGNOSIS — K648 Other hemorrhoids: Secondary | ICD-10-CM | POA: Insufficient documentation

## 2011-01-04 DIAGNOSIS — E039 Hypothyroidism, unspecified: Secondary | ICD-10-CM | POA: Insufficient documentation

## 2011-01-04 DIAGNOSIS — F988 Other specified behavioral and emotional disorders with onset usually occurring in childhood and adolescence: Secondary | ICD-10-CM | POA: Insufficient documentation

## 2011-01-04 DIAGNOSIS — K5730 Diverticulosis of large intestine without perforation or abscess without bleeding: Secondary | ICD-10-CM | POA: Insufficient documentation

## 2011-01-04 DIAGNOSIS — R197 Diarrhea, unspecified: Secondary | ICD-10-CM | POA: Insufficient documentation

## 2011-01-04 DIAGNOSIS — K219 Gastro-esophageal reflux disease without esophagitis: Secondary | ICD-10-CM | POA: Insufficient documentation

## 2011-01-04 DIAGNOSIS — E785 Hyperlipidemia, unspecified: Secondary | ICD-10-CM | POA: Insufficient documentation

## 2011-01-04 DIAGNOSIS — D649 Anemia, unspecified: Secondary | ICD-10-CM | POA: Insufficient documentation

## 2011-01-04 DIAGNOSIS — K644 Residual hemorrhoidal skin tags: Secondary | ICD-10-CM | POA: Insufficient documentation

## 2011-01-04 DIAGNOSIS — J45909 Unspecified asthma, uncomplicated: Secondary | ICD-10-CM | POA: Insufficient documentation

## 2011-01-04 DIAGNOSIS — K5669 Other intestinal obstruction: Secondary | ICD-10-CM | POA: Insufficient documentation

## 2011-01-04 DIAGNOSIS — I509 Heart failure, unspecified: Secondary | ICD-10-CM | POA: Insufficient documentation

## 2011-01-08 ENCOUNTER — Telehealth: Payer: Self-pay | Admitting: Family Medicine

## 2011-01-08 NOTE — Telephone Encounter (Signed)
error 

## 2011-01-23 ENCOUNTER — Encounter: Payer: Self-pay | Admitting: Family Medicine

## 2011-01-28 ENCOUNTER — Telehealth: Payer: Self-pay | Admitting: Family Medicine

## 2011-01-28 MED ORDER — VALSARTAN 160 MG PO TABS: 160.0000 mg | ORAL_TABLET | Freq: Two times a day (BID) | ORAL | Status: DC

## 2011-01-28 NOTE — Telephone Encounter (Signed)
Refill request for Diovan 160 mg, pt last here on 12/19/10. Script approved for 12 months.

## 2011-01-30 ENCOUNTER — Telehealth: Payer: Self-pay | Admitting: Family Medicine

## 2011-01-30 NOTE — Telephone Encounter (Signed)
Refill request for Exelon 4.6 mg/24hr patch apply 1 patch topically qd. Pt last here on 12/19/10 and script last filled on 12/24/10. Please send to [facility], fax # [phone].

## 2011-01-30 NOTE — Telephone Encounter (Signed)
Refill request for Olanzapine 10 mg take 1/2 tab po bid. Pt last here on 12/19/10 and script last filled on 01/08/11. Please fax to [facility] at [phone].

## 2011-01-31 NOTE — Telephone Encounter (Signed)
Script were faxed to resident center, Dr. Clent Ridges does not prescribe these medications.

## 2011-01-31 NOTE — Telephone Encounter (Signed)
Her Neurologist at [facility], Dr. Louanna Raw, gives her the Exelon. Her Psychiatrist, Dr. Nolen Mu, gives her the [place]. She needs to ask these folks for refills

## 2011-02-09 ENCOUNTER — Emergency Department (INDEPENDENT_AMBULATORY_CARE_PROVIDER_SITE_OTHER): Payer: Medicare PPO

## 2011-02-09 ENCOUNTER — Emergency Department (HOSPITAL_BASED_OUTPATIENT_CLINIC_OR_DEPARTMENT_OTHER)
Admission: EM | Admit: 2011-02-09 | Discharge: 2011-02-09 | Disposition: A | Discharge status: Home / Self Care | Payer: Medicare PPO | Attending: Emergency Medicine | Admitting: Emergency Medicine

## 2011-02-09 ENCOUNTER — Encounter (HOSPITAL_BASED_OUTPATIENT_CLINIC_OR_DEPARTMENT_OTHER): Payer: Self-pay | Admitting: Emergency Medicine

## 2011-02-09 DIAGNOSIS — E039 Hypothyroidism, unspecified: Secondary | ICD-10-CM | POA: Insufficient documentation

## 2011-02-09 DIAGNOSIS — W01119A Fall on same level from slipping, tripping and stumbling with subsequent striking against unspecified sharp object, initial encounter: Secondary | ICD-10-CM

## 2011-02-09 DIAGNOSIS — I509 Heart failure, unspecified: Secondary | ICD-10-CM | POA: Insufficient documentation

## 2011-02-09 DIAGNOSIS — S61209A Unspecified open wound of unspecified finger without damage to nail, initial encounter: Secondary | ICD-10-CM | POA: Insufficient documentation

## 2011-02-09 DIAGNOSIS — I10 Essential (primary) hypertension: Secondary | ICD-10-CM | POA: Insufficient documentation

## 2011-02-09 DIAGNOSIS — W269XXA Contact with unspecified sharp object(s), initial encounter: Secondary | ICD-10-CM

## 2011-02-09 DIAGNOSIS — J45909 Unspecified asthma, uncomplicated: Secondary | ICD-10-CM | POA: Insufficient documentation

## 2011-02-09 DIAGNOSIS — E785 Hyperlipidemia, unspecified: Secondary | ICD-10-CM | POA: Insufficient documentation

## 2011-02-09 DIAGNOSIS — Z79899 Other long term (current) drug therapy: Secondary | ICD-10-CM | POA: Insufficient documentation

## 2011-02-09 DIAGNOSIS — Y9210 Unspecified residential institution as the place of occurrence of the external cause: Secondary | ICD-10-CM | POA: Insufficient documentation

## 2011-02-09 DIAGNOSIS — S61219A Laceration without foreign body of unspecified finger without damage to nail, initial encounter: Secondary | ICD-10-CM

## 2011-02-09 DIAGNOSIS — M25519 Pain in unspecified shoulder: Secondary | ICD-10-CM

## 2011-02-09 DIAGNOSIS — F0390 Unspecified dementia without behavioral disturbance: Secondary | ICD-10-CM | POA: Insufficient documentation

## 2011-02-09 DIAGNOSIS — W1809XA Striking against other object with subsequent fall, initial encounter: Secondary | ICD-10-CM | POA: Insufficient documentation

## 2011-02-09 NOTE — ED Notes (Signed)
Pt to ED via EMS s/p fall at [facility]- laceration to LT index finger per EMS.

## 2011-02-09 NOTE — ED Notes (Signed)
[facility] notified of need for transport. 

## 2011-02-09 NOTE — ED Notes (Signed)
Patient is resting comfortably.  Awaiting disposition; no needs identified.

## 2011-02-09 NOTE — ED Provider Notes (Signed)
History   Scribed for Vida Roller, MD, the patient was seen in room MH10/MH10 . This chart was scribed by Desma Paganini. This patient's care was started at 7:01 PM .    CSN: [id number] Arrival date & time: 02/09/2011  6:52 PM  Chief Complaint  Patient presents with  . Laceration  . Fall   Patient is a 65 y.o. female presenting with skin laceration and fall. The history is provided by the patient. No language interpreter was used.  Laceration   Fall   Lorraine [name] is a 65 y.o. female who presents to the Emergency Department complaining of laceration to left index finger s/p fall. The patient was walking when she suddenly had "knee problems" and fell. Upon falling, the pt hit her hand on a metal cart and cut her left index finger. She has a history of falling and knee problems. Pt denies dizziness,  pain in right arm/ wrist /back however, the left shoulder was mildly tender to touch.The pt doesn't recall her last tetanus shot.   HPI ELEMENTS:  Location: palmer surface of distal phalanx of left index finger   Onset: few hours prior  Severity: mild Context: as above  Associated symptoms: history of knee problems and falls  PAST MEDICAL HISTORY:  Past Medical History  Diagnosis Date  . CHF (congestive heart failure)   . NICM (nonischemic cardiomyopathy)   . Mitral regurgitation     moderate - severe  . HTN (hypertension)   . HLD (hyperlipidemia)   . GERD (gastroesophageal reflux disease)   . Knee pain   . Anemia   . Dementia   . Glaucoma   . Osteoarthritis   . Hypothyroidism   . Colonic polyp   . Parkinson's disease     Sees Dr Louanna Raw at [facility]  . Depression     sees [name]  . Asthma     followed by [name]  . Nephrolithiasis   . Glaucoma      PAST SURGICAL HISTORY:  Past Surgical History  Procedure Date  . Cholecystectomy   . Tonsillectomy   . Cardiac catheterization 04/2007    normal coronaries  . Total knee arthroplasty    right. Synvisc injections to the knees     MEDICATIONS:  Previous Medications   ALPRAZOLAM (XANAX) 0.5 MG TABLET    Take 0.5-2 mg by mouth 2 (two) times daily. Take 1 tab in the morning, 2 tabs at 5pm and 4 tabs at bedtime   ALPRAZOLAM (XANAX) 0.5 MG TABLET    Take 1 mg by mouth every 4 (four) hours as needed. anxiety    ASPIRIN 81 MG TABLET    Take 81 mg by mouth daily.     CALCIUM CARBONATE-VITAMIN D 600-400 MG-UNIT PER TABLET    Take 1 tablet by mouth 2 (two) times daily.     CARBAMAZEPINE (TEGRETOL) 200 MG TABLET    Take 200 mg by mouth 3 (three) times daily.    CARBIDOPA-LEVODOPA (SINEMET) 25-100 MG PER TABLET    Take 1 tablet by mouth 3 (three) times daily.     CARVEDILOL (COREG) 6.25 MG TABLET    Take 6.25 mg by mouth 2 (two) times daily with a meal.     DENTIFRICES (BIOTENE DRY MOUTH) GEL    Place onto teeth every 2 (two) hours as needed. Dry mouth    DOXEPIN (SINEQUAN) 50 MG CAPSULE    Take 50 mg by mouth at bedtime.  HYDROCHLOROTHIAZIDE 25 MG TABLET    Take 25 mg by mouth daily.     HYDROXYZINE (VISTARIL) 25 MG CAPSULE    Take 25 mg by mouth 2 (two) times daily.    HYDROXYZINE (VISTARIL) 25 MG CAPSULE    Take 25 mg by mouth every 6 (six) hours as needed. itching    LAMOTRIGINE (LAMICTAL) 200 MG TABLET    Take 200 mg by mouth daily.     LOPERAMIDE (IMODIUM) 2 MG CAPSULE    Take 2 mg by mouth 4 (four) times daily as needed. Diarrhea/loose stools   LORATADINE (CLARITIN) 10 MG TABLET    Take 10 mg by mouth daily.     NAPROXEN SODIUM (ANAPROX) 220 MG TABLET    Take 440 mg by mouth 2 (two) times daily with a meal.    OLANZAPINE (ZYPREXA) 10 MG TABLET    Take one half tablet twice daily by mouth    OLANZAPINE (ZYPREXA) 20 MG TABLET    Take 10 mg by mouth 2 (two) times daily.     OMEPRAZOLE (PRILOSEC) 20 MG CAPSULE    Take 20 mg by mouth daily.     PAROXETINE (PAXIL) 40 MG TABLET    Take 40 mg by mouth every morning.     PRENATAL VIT-FE PSAC CMPLX-FA (PRENATAL MULTIVITAMIN) 60-1 MG  TABLET    Take 1 tablet by mouth daily with breakfast.     RIVASTIGMINE (EXELON) 4.6 MG/24HR    Place 1 patch onto the skin daily.     SODIUM FLUORIDE, DENTAL GEL, (SF) 1.1 % GEL    Place onto teeth at bedtime.     TEMAZEPAM (RESTORIL) 15 MG CAPSULE    Take 15 mg by mouth at bedtime as needed.     TEMAZEPAM (RESTORIL) 30 MG CAPSULE    Take 30 mg by mouth at bedtime.     TRAVOPROST, BENZALKONIUM, (TRAVATAN) 0.004 % OPHTHALMIC SOLUTION    Place 1 drop into both eyes daily.    TRAZODONE (DESYREL) 100 MG TABLET    Take 100 mg by mouth at bedtime.     TRAZODONE (DESYREL) 50 MG TABLET    Take 50 mg by mouth daily.      ALLERGIES:  Allergies as of 02/09/2011 - Review Complete 02/09/2011  Allergen Reaction Noted  . Donepezil hydrochloride  03/03/2009  . Tramadol  03/03/2009     FAMILY HISTORY:  Family History  Problem Relation Age of Onset  . Arthritis    . Breast cancer    . Diabetes    . Hyperlipidemia    . Stroke    . Cancer Other     breast     SOCIAL HISTORY: History  Substance Use Topics  . Smoking status: Never Smoker   . Smokeless tobacco: Not on file  . Alcohol Use: No    OB History    Grav Para Term Preterm Abortions TAB SAB Ect Mult Living                  Review of Systems 10 Systems reviewed and are negative for acute change except as noted in the HPI.  Physical Exam  BP 124/63  Pulse 65  Temp(Src) 97.5 F (36.4 C) (Oral)  Resp 20  SpO2 99%  Physical Exam  Nursing note and vitals reviewed. Constitutional: She is oriented to person, place, and time. She appears well-developed and well-nourished.  HENT:  Head: Normocephalic and atraumatic.  Eyes: EOM are normal. Pupils are equal, round,  and reactive to light.  Neck: Normal range of motion. Neck supple.  Cardiovascular: Normal rate, normal heart sounds and intact distal pulses.   Pulmonary/Chest: Effort normal and breath sounds normal.  Abdominal: Bowel sounds are normal. She exhibits no distension.  There is no tenderness.  Musculoskeletal: Normal range of motion. She exhibits no edema and no tenderness.       Left shoulder: mild tenderness upon palpation  Superficial skin tear on left palmar surface of proximal phalanx of little finger 1.5 cm subcutaneous fillet laceration on distal surface phalanx of the index finger  Neurological: She is alert and oriented to person, place, and time. No cranial nerve deficit.  Skin: Skin is warm and dry. No rash noted.  Psychiatric: She has a normal mood and affect.    ED Course  LACERATION REPAIR Date/Time: 02/09/2011 9:22 PM Performed by: Teressa Lower Authorized by: Eber Hong D Consent: Verbal consent obtained. Written consent not obtained. Consent given by: patient Patient understanding: patient states understanding of the procedure being performed Patient identity confirmed: verbally with patient Time out: Immediately prior to procedure a "time out" was called to verify the correct patient, procedure, equipment, support staff and site/side marked as required. Body area: upper extremity Location details: left index finger Laceration length: 1.5 cm Foreign bodies: no foreign bodies Tendon involvement: none Nerve involvement: none Vascular damage: no Anesthesia: digital block Local anesthetic: lidocaine 2% without epinephrine Irrigation solution: saline Irrigation method: syringe Amount of cleaning: standard Debridement: none Degree of undermining: none Skin closure: 4-0 Prolene Number of sutures: 3 Technique: simple Approximation: close    Dg Shoulder Left  02/09/2011  *RADIOLOGY REPORT*  Clinical Data: Left shoulder pain post fall  LEFT SHOULDER - 2+ VIEW  Comparison: None  Findings: Osseous demineralization. Minimal left AC joint degenerative changes. No acute fracture, dislocation, or bone destruction. Visualized left ribs intact.  IMPRESSION: No acute osseous abnormalities.  Original Report Authenticated By: Lollie Marrow, M.D.   Dg Hand Complete Left  02/09/2011  *RADIOLOGY REPORT*  Clinical Data: Laceration left index finger, fall  LEFT HAND - COMPLETE 3+ VIEW  Comparison: None  Findings: Soft tissue deformity at distal aspect of left index finger. Diffuse osseous demineralization. Minimal scattered narrowing of interphalangeal joints. No acute fracture, dislocation, or bone destruction.  IMPRESSION: Osseous demineralization. No acute bony abnormalities.  Original Report Authenticated By: Lollie Marrow, M.D.    The fall was purely mechanical, the imaging was negative for any signs of fracture or foreign bodies. The laceration was repaired by the Teressa Lower NP      Vida Roller, MD 02/09/11 [phone]

## 2011-02-11 ENCOUNTER — Other Ambulatory Visit: Payer: Self-pay

## 2011-02-11 MED ORDER — CALCIUM CARBONATE-VITAMIN D 600-400 MG-UNIT PO TABS: 1.0000 | ORAL_TABLET | Freq: Two times a day (BID) | ORAL | Status: DC

## 2011-02-11 NOTE — Telephone Encounter (Signed)
Faxed back to [facility], ph [phone]   Fax [phone]

## 2011-02-12 DIAGNOSIS — Z0279 Encounter for issue of other medical certificate: Secondary | ICD-10-CM

## 2011-02-15 ENCOUNTER — Ambulatory Visit (INDEPENDENT_AMBULATORY_CARE_PROVIDER_SITE_OTHER): Payer: Medicare PPO | Admitting: Family Medicine

## 2011-02-15 ENCOUNTER — Encounter: Payer: Self-pay | Admitting: Family Medicine

## 2011-02-15 VITALS — BP 108/60 | HR 73 | Temp 97.6°F | Wt 160.0 lb

## 2011-02-15 DIAGNOSIS — S61219A Laceration without foreign body of unspecified finger without damage to nail, initial encounter: Secondary | ICD-10-CM

## 2011-02-15 DIAGNOSIS — S61209A Unspecified open wound of unspecified finger without damage to nail, initial encounter: Secondary | ICD-10-CM

## 2011-02-15 NOTE — Progress Notes (Signed)
  Subjective:    Patient ID: Lorraine [name], female    DOB: 1945/07/21, 65 y.o.   MRN: [id number]  HPI Here to have sutures removed from her left index finger. On 02-08-11 she fell at her nursing home and cut her index finger against the side of a cart. She had sutures placed at an Urgent Care and was told to follow up with me. The finger is a little sore but not bad.    Review of Systems  Constitutional: Negative.        Objective:   Physical Exam  Constitutional: She appears well-developed and well-nourished.  Skin:       The left index finger has a healing laceration on the pad which looks good.           Assessment & Plan:  There is one remaining suture and this was removed. Keep the wound covered with a bandaid for one more week.

## 2011-02-26 ENCOUNTER — Telehealth: Payer: Self-pay | Admitting: Family Medicine

## 2011-02-26 ENCOUNTER — Other Ambulatory Visit: Payer: Self-pay

## 2011-02-26 MED ORDER — HYDROXYZINE PAMOATE 25 MG PO CAPS: 25.0000 mg | ORAL_CAPSULE | Freq: Four times a day (QID) | ORAL | Status: DC | PRN

## 2011-02-26 MED ORDER — NAPROXEN SODIUM 220 MG PO TABS: 440.0000 mg | ORAL_TABLET | Freq: Two times a day (BID) | ORAL | Status: DC

## 2011-02-26 MED ORDER — TRAZODONE HCL 100 MG PO TABS: 100.0000 mg | ORAL_TABLET | Freq: Every day | ORAL | Status: DC

## 2011-02-26 NOTE — Telephone Encounter (Signed)
Refill request was faxed back to [facility].

## 2011-02-26 NOTE — Telephone Encounter (Signed)
Call in one year of each  

## 2011-02-26 NOTE — Telephone Encounter (Signed)
Rx request from [facility] for hydroxyzine pamoate 25 mg, naproxen sodium 220 mg, and trazodone hcl 100 mg; pt last seen on 02/15/11. Pls advise.

## 2011-02-26 NOTE — Telephone Encounter (Signed)
Her Parkinsons dementia is treated by Dr. Louanna Raw at [facility]. This request should go to him.

## 2011-02-26 NOTE — Telephone Encounter (Signed)
Refill request for Exelon 4.6 mg/24 hr patch apply 1 patch topically qd. Pt last here on 02/15/11 and script last filled on 01/29/11.

## 2011-03-08 ENCOUNTER — Telehealth: Payer: Self-pay | Admitting: Family Medicine

## 2011-03-08 MED ORDER — CLONAZEPAM 1 MG PO TABS: 1.0000 mg | ORAL_TABLET | Freq: Two times a day (BID) | ORAL | Status: DC | PRN

## 2011-03-08 NOTE — Telephone Encounter (Signed)
Open in errror

## 2011-03-08 NOTE — Telephone Encounter (Signed)
done

## 2011-03-12 ENCOUNTER — Telehealth: Payer: Self-pay | Admitting: Family Medicine

## 2011-03-12 NOTE — Telephone Encounter (Signed)
Had questions regarding new medications. Please contact [facility].

## 2011-03-13 NOTE — Telephone Encounter (Signed)
Pts sister called. She has poa. Pts meds have been changed by a doctor at [facility] assisted living and now pt is doing horrible. Pt is have hallucinations and can not determine what is real and what isn't. Pts sister is very concerned and is req that [name] call [facility] and have meds changed back to the orignal meds that [name] prescribed.

## 2011-03-14 ENCOUNTER — Telehealth: Payer: Self-pay | Admitting: Family Medicine

## 2011-03-14 NOTE — Telephone Encounter (Signed)
I called and left voice message for sister Marjean Donna. Dr. Clent Ridges did change some of the medications. I will call [facility] and give new directions.

## 2011-03-14 NOTE — Telephone Encounter (Signed)
Pharmacist needs qty infor on Temazepam and Alprazolam. Pls call.

## 2011-03-14 NOTE — Telephone Encounter (Signed)
I called [facility] and faxed over new order for medication change. I also called the sister and left a vocie message.

## 2011-03-14 NOTE — Telephone Encounter (Signed)
I have ordered them to change her meds back to what she was on before. Please inform the sister and then call [facility]

## 2011-03-15 NOTE — Telephone Encounter (Signed)
Spoke with Pharmacist at [facility] and pt's alprazolam 0.5 mg #240 x 5 rf and temazepam 15 mg #30 x5rf. Ok per Dr. Clent Ridges.

## 2011-03-19 ENCOUNTER — Telehealth: Payer: Self-pay | Admitting: Family Medicine

## 2011-03-19 LAB — DIFFERENTIAL
Basophils Absolute: 0
Basophils Relative: 1
Eosinophils Absolute: 0.9 — ABNORMAL HIGH
Lymphocytes Relative: 20
Monocytes Absolute: 0.6
Neutro Abs: 4

## 2011-03-19 LAB — D-DIMER, QUANTITATIVE: D-Dimer, Quant: 0.33

## 2011-03-19 LAB — CBC
Hemoglobin: 12.4
MCHC: 34.2
MCV: 85.7
Platelets: 148 — ABNORMAL LOW
RDW: 14.9

## 2011-03-19 LAB — POCT I-STAT, CHEM 8
BUN: 18
Calcium, Ion: 1.25
Chloride: 104
Glucose, Bld: 91

## 2011-03-19 LAB — POCT CARDIAC MARKERS: Troponin i, poc: <0.05

## 2011-03-19 MED ORDER — CALCIUM CARBONATE 600 MG PO TABS: 600.0000 mg | ORAL_TABLET | Freq: Two times a day (BID) | ORAL | Status: DC

## 2011-03-19 MED ORDER — ALPRAZOLAM 0.5 MG PO TABS: 0.5000 mg | ORAL_TABLET | Freq: Every day | ORAL | Status: DC

## 2011-03-19 MED ORDER — TEMAZEPAM 15 MG PO CAPS: 15.0000 mg | ORAL_CAPSULE | Freq: Every evening | ORAL | Status: DC | PRN

## 2011-03-19 NOTE — Telephone Encounter (Signed)
Refill request for Calcium Carbonate 600 mg take 1 po bid. Print and fax to [phone].

## 2011-03-19 NOTE — Telephone Encounter (Signed)
Script faxed to [phone]

## 2011-03-19 NOTE — Telephone Encounter (Signed)
okay

## 2011-03-20 ENCOUNTER — Telehealth: Payer: Self-pay | Admitting: Family Medicine

## 2011-03-20 MED ORDER — CIPROFLOXACIN HCL 500 MG PO TABS: 500.0000 mg | ORAL_TABLET | Freq: Two times a day (BID) | ORAL | Status: AC

## 2011-03-20 NOTE — Telephone Encounter (Signed)
Script faxed.

## 2011-04-02 LAB — TSH: TSH: 1.584

## 2011-04-02 LAB — CROSSMATCH: ABO/RH(D): A POS

## 2011-04-02 LAB — BASIC METABOLIC PANEL
BUN: 11
BUN: 16
BUN: 19
CO2: 26
CO2: 28
CO2: 29
CO2: 29
Chloride: 102
Chloride: 104
Creatinine, Ser: 0.78
GFR calc Af Amer: >60
GFR calc Af Amer: >60
GFR calc non Af Amer: >60
GFR calc non Af Amer: >60
Glucose, Bld: 103 — ABNORMAL HIGH
Glucose, Bld: 91
Glucose, Bld: 93
Glucose, Bld: 114 — ABNORMAL HIGH
Potassium: 3.1 — ABNORMAL LOW
Potassium: 3.1 — ABNORMAL LOW
Potassium: 3.2 — ABNORMAL LOW
Potassium: 3.9
Potassium: 4.1
Sodium: 138
Sodium: 140
Sodium: 140

## 2011-04-02 LAB — COMPREHENSIVE METABOLIC PANEL
ALT: 86 — ABNORMAL HIGH
ALT: 23
AST: 132 — ABNORMAL HIGH
AST: 33
Albumin: 3.3 — ABNORMAL LOW
Albumin: 3.4 — ABNORMAL LOW
Alkaline Phosphatase: 41
Alkaline Phosphatase: 45
BUN: 29 — ABNORMAL HIGH
CO2: 27
Chloride: 98
Chloride: 104
GFR calc Af Amer: >60
GFR calc non Af Amer: 57 — ABNORMAL LOW
Potassium: 3.3 — ABNORMAL LOW
Potassium: 2.9 — ABNORMAL LOW
Sodium: 133 — ABNORMAL LOW
Sodium: 142
Total Bilirubin: 3 — ABNORMAL HIGH
Total Bilirubin: 2.7 — ABNORMAL HIGH

## 2011-04-02 LAB — CARDIAC PANEL(CRET KIN+CKTOT+MB+TROPI)
CK, MB: 1.7
CK, MB: 1.9
CK, MB: 2
CK, MB: 2
Relative Index: INVALID
Relative Index: 2
Relative Index: INVALID
Total CK: 83
Total CK: 97
Total CK: 100
Total CK: 103
Total CK: 87
Troponin I: 0.02
Troponin I: 0.03
Troponin I: 0.03
Troponin I: 0.04

## 2011-04-02 LAB — CBC
HCT: 28.8 — ABNORMAL LOW
HCT: 29.8 — ABNORMAL LOW
HCT: 32 — ABNORMAL LOW
HCT: 27.9 — ABNORMAL LOW
Hemoglobin: 9.1 — ABNORMAL LOW
Hemoglobin: 9.8 — ABNORMAL LOW
Hemoglobin: 8.4 — ABNORMAL LOW
MCHC: 30.6
MCHC: 31.5
MCV: 71.1 — ABNORMAL LOW
MCV: 70 — ABNORMAL LOW
Platelets: 269
Platelets: 229
RBC: 4.19
RBC: 4.51
RBC: 3.82 — ABNORMAL LOW
RDW: 20.4 — ABNORMAL HIGH
RDW: 21.1 — ABNORMAL HIGH
WBC: 8.6
WBC: 6.8
WBC: 7.9

## 2011-04-02 LAB — BLOOD GAS, ARTERIAL
Bicarbonate: 21.6
O2 Saturation: 94.9
pO2, Arterial: 78.4 — ABNORMAL LOW

## 2011-04-02 LAB — CEA: CEA: 3.2

## 2011-04-02 LAB — VITAMIN B12: Vitamin B-12: 1453 — ABNORMAL HIGH (ref 211–911)

## 2011-04-02 LAB — POCT I-STAT 3, VENOUS BLOOD GAS (G3P V): pH, Ven: 7.399 — ABNORMAL HIGH

## 2011-04-02 LAB — RETICULOCYTES
RBC.: 4.03
Retic Count, Absolute: 152.3
Retic Ct Pct: 3.6 — ABNORMAL HIGH
Retic Ct Pct: 1.8

## 2011-04-02 LAB — SAVE SMEAR

## 2011-04-02 LAB — HAPTOGLOBIN: Haptoglobin: <6 — ABNORMAL LOW

## 2011-04-02 LAB — POCT I-STAT 3, ART BLOOD GAS (G3+)
Acid-Base Excess: 3 — ABNORMAL HIGH
Operator id: 142231
pCO2 arterial: 35.6
pO2, Arterial: 62 — ABNORMAL LOW

## 2011-04-02 LAB — LIPID PANEL
Cholesterol: 127
LDL Cholesterol: 102 — ABNORMAL HIGH

## 2011-04-02 LAB — APTT: aPTT: 32

## 2011-04-02 LAB — PROTIME-INR
INR: 1.8 — ABNORMAL HIGH
Prothrombin Time: 21.3 — ABNORMAL HIGH

## 2011-04-02 LAB — DIRECT ANTIGLOBULIN TEST (NOT AT ARMC): DAT, IgG: NEGATIVE

## 2011-04-02 LAB — HIV-1 RNA, QUALITATIVE, TMA: HIV-1 RNA, Qualitative, TMA: NOT DETECTED

## 2011-04-02 LAB — OCCULT BLOOD X 1 CARD TO LAB, STOOL: Fecal Occult Bld: NEGATIVE

## 2011-04-02 LAB — FERRITIN: Ferritin: 19 (ref 10–291)

## 2011-04-02 LAB — BILIRUBIN, FRACTIONATED(TOT/DIR/INDIR): Bilirubin, Direct: 1 — ABNORMAL HIGH

## 2011-04-02 LAB — LACTATE DEHYDROGENASE: LDH: 430 — ABNORMAL HIGH

## 2011-04-02 LAB — T4, FREE: Free T4: 1.2

## 2011-04-03 ENCOUNTER — Telehealth: Payer: Self-pay | Admitting: Family Medicine

## 2011-04-03 NOTE — Telephone Encounter (Signed)
Refill request for Exelon 4.6 mg/24 hr patch, apply 1 patch qd and pt last here on 02/15/11. Please fax to [phone] [facility].

## 2011-04-04 LAB — CROSSMATCH

## 2011-04-04 NOTE — Telephone Encounter (Signed)
This needs to come from her Neurologist at [facility]

## 2011-04-05 NOTE — Telephone Encounter (Signed)
Denied and paper was faxed back.

## 2011-05-10 ENCOUNTER — Telehealth: Payer: Self-pay | Admitting: Family Medicine

## 2011-05-10 MED ORDER — TRAZODONE HCL 100 MG PO TABS: 100.0000 mg | ORAL_TABLET | Freq: Every day | ORAL | Status: DC

## 2011-05-10 MED ORDER — TRAZODONE HCL 100 MG PO TABS: 100.0000 mg | ORAL_TABLET | Freq: Two times a day (BID) | ORAL | Status: DC

## 2011-05-10 NOTE — Telephone Encounter (Signed)
rx sent into pharmacy

## 2011-05-10 NOTE — Telephone Encounter (Signed)
Call in #[phone] with 11 rf 

## 2011-05-10 NOTE — Telephone Encounter (Signed)
Refill request for Trazodone HCL 100 mg take 1 qhs and 1 again if initial dose is ineffective.

## 2011-05-15 ENCOUNTER — Other Ambulatory Visit: Payer: Self-pay | Admitting: Family Medicine

## 2011-05-15 NOTE — Telephone Encounter (Signed)
Refill request for Doxepain HCL 50 mg take 1 po qhs. Send to [facility] fax # [phone] and phone # [phone].

## 2011-05-15 NOTE — Telephone Encounter (Signed)
Refill for one year 

## 2011-05-21 MED ORDER — DOXEPIN HCL 50 MG PO CAPS: 50.0000 mg | ORAL_CAPSULE | Freq: Every day | ORAL | Status: DC

## 2011-05-21 NOTE — Telephone Encounter (Signed)
done

## 2011-06-04 ENCOUNTER — Encounter: Payer: Self-pay | Admitting: Family Medicine

## 2011-06-11 ENCOUNTER — Telehealth: Payer: Self-pay | Admitting: Family Medicine

## 2011-06-11 NOTE — Telephone Encounter (Signed)
Refill request for Arthricream rub with aloe 10% cream (Aspercreme) apply tid prn and send to [facility] fax # [phone] and  phone # [phone].

## 2011-06-14 MED ORDER — TROLAMINE SALICYLATE 10 % EX CREA: TOPICAL_CREAM | Freq: Three times a day (TID) | CUTANEOUS | Status: DC | PRN

## 2011-06-14 NOTE — Telephone Encounter (Signed)
Script faxed and approved by Dr. Clent Ridges.

## 2011-06-28 ENCOUNTER — Emergency Department (HOSPITAL_BASED_OUTPATIENT_CLINIC_OR_DEPARTMENT_OTHER)
Admission: EM | Admit: 2011-06-28 | Discharge: 2011-06-28 | Disposition: A | Discharge status: Home / Self Care | Payer: Medicare PPO | Attending: Emergency Medicine | Admitting: Emergency Medicine

## 2011-06-28 ENCOUNTER — Encounter (HOSPITAL_BASED_OUTPATIENT_CLINIC_OR_DEPARTMENT_OTHER): Payer: Self-pay | Admitting: Family Medicine

## 2011-06-28 DIAGNOSIS — J45909 Unspecified asthma, uncomplicated: Secondary | ICD-10-CM | POA: Insufficient documentation

## 2011-06-28 DIAGNOSIS — Z79899 Other long term (current) drug therapy: Secondary | ICD-10-CM | POA: Insufficient documentation

## 2011-06-28 DIAGNOSIS — S0003XA Contusion of scalp, initial encounter: Secondary | ICD-10-CM | POA: Insufficient documentation

## 2011-06-28 DIAGNOSIS — E785 Hyperlipidemia, unspecified: Secondary | ICD-10-CM | POA: Insufficient documentation

## 2011-06-28 DIAGNOSIS — S1093XA Contusion of unspecified part of neck, initial encounter: Secondary | ICD-10-CM | POA: Insufficient documentation

## 2011-06-28 DIAGNOSIS — K219 Gastro-esophageal reflux disease without esophagitis: Secondary | ICD-10-CM | POA: Insufficient documentation

## 2011-06-28 DIAGNOSIS — I10 Essential (primary) hypertension: Secondary | ICD-10-CM | POA: Insufficient documentation

## 2011-06-28 DIAGNOSIS — W19XXXA Unspecified fall, initial encounter: Secondary | ICD-10-CM | POA: Insufficient documentation

## 2011-06-28 DIAGNOSIS — F0280 Dementia in other diseases classified elsewhere without behavioral disturbance: Secondary | ICD-10-CM | POA: Insufficient documentation

## 2011-06-28 DIAGNOSIS — I509 Heart failure, unspecified: Secondary | ICD-10-CM | POA: Insufficient documentation

## 2011-06-28 NOTE — ED Notes (Signed)
Pt denies complaints. Examined by Dr Patrica Duel and released to go back to the nursing home. Drinking sprite per pt request. [facility] called to transport pt back to [facility].

## 2011-06-28 NOTE — ED Notes (Signed)
PTAR at bedside and preparing for transfer to assisted living.

## 2011-06-28 NOTE — ED Notes (Addendum)
Per EMS, pt fell from standing. No LOC. Witnessed fall at [facility]. Pt denies neck and back pain. Pt sts "they had to send me to get checked because I hit my head". Pt denies dizziness, n/v.

## 2011-06-28 NOTE — ED Provider Notes (Addendum)
History     CSN: [id number]  Arrival date & time 06/28/11  1056   First MD Initiated Contact with Patient 06/28/11 1104      Chief Complaint  Patient presents with  . Fall   elderly female with multiple medical problems. She presents from the assisted-living facility. Patient accidentally fallen her legs got up, describing a mechanical fall. She had no preceding symptoms. She fell and bumped the right side of her head but did not pass out. Her pain is very mild approximately 2/10. She has no nausea, vomiting, or status changes. She denies any injury to her neck, chest, abdomen, pelvis or extremities. Patient states, "I didn't really feel he needed to come in". She has no other complaints at this time. No recent illness.  Initial vital signs are stable  (Consider location/radiation/quality/duration/timing/severity/associated sxs/prior treatment) HPI  Past Medical History  Diagnosis Date  . CHF (congestive heart failure)   . NICM (nonischemic cardiomyopathy)   . Mitral regurgitation     moderate - severe  . HTN (hypertension)   . HLD (hyperlipidemia)   . GERD (gastroesophageal reflux disease)   . Knee pain   . Anemia   . Dementia   . Glaucoma   . Osteoarthritis   . Hypothyroidism   . Colonic polyp   . Parkinson's disease     Sees Dr Louanna Raw at [facility]  . Depression     sees [name]  . Asthma     followed by [name]  . Nephrolithiasis   . Glaucoma     Past Surgical History  Procedure Date  . Cholecystectomy   . Tonsillectomy   . Cardiac catheterization 04/2007    normal coronaries  . Total knee arthroplasty     right. Synvisc injections to the knees    Family History  Problem Relation Age of Onset  . Arthritis    . Breast cancer    . Diabetes    . Hyperlipidemia    . Stroke    . Cancer Other     breast    History  Substance Use Topics  . Smoking status: Never Smoker   . Smokeless tobacco: Never Used  . Alcohol Use: No    OB  History    Grav Para Term Preterm Abortions TAB SAB Ect Mult Living                  Review of Systems  Respiratory: Negative for chest tightness.   Neurological: Negative for syncope.  All other systems reviewed and are negative.    Allergies  Donepezil hydrochloride; Erythromycin; and Tramadol  Home Medications   Current Outpatient Rx  Name Route Sig Dispense Refill  . ALPRAZOLAM 0.5 MG PO TABS Oral Take 1 tablet (0.5 mg total) by mouth daily. anxiety 240 tablet 5    Take 1 po qam and 1 po at noon. Take 2 po at 5 pm. ...  . ASPIRIN 81 MG PO TABS Oral Take 81 mg by mouth daily.      Marland Kitchen CALCIUM CARBONATE 600 MG PO TABS Oral Take 1 tablet (600 mg total) by mouth 2 (two) times daily with a meal. 60 tablet 6  . CALCIUM CARBONATE-VITAMIN D 600-400 MG-UNIT PO TABS Oral Take 1 tablet by mouth 2 (two) times daily. 60 tablet 11  . CARBAMAZEPINE 200 MG PO TABS Oral Take 200 mg by mouth 3 (three) times daily.     Marland Kitchen CARBIDOPA-LEVODOPA 25-100 MG PO TABS Oral Take  1 tablet by mouth 3 (three) times daily.      Marland Kitchen CARVEDILOL 6.25 MG PO TABS Oral Take 6.25 mg by mouth 2 (two) times daily with a meal.      . CLONAZEPAM 1 MG PO TABS Oral Take 1 tablet (1 mg total) by mouth 2 (two) times daily as needed. 60 tablet 5    Called in to [facility] [phone]  . BIOTENE DRY MOUTH DT GEL dental Place onto teeth every 2 (two) hours as needed. Dry mouth     . DIPHENOXYLATE-ATROPINE 2.5-0.025 MG PO TABS Oral Take 1 tablet by mouth 3 (three) times daily as needed. significant loose stools     . DOXEPIN HCL 50 MG PO CAPS Oral Take 1 capsule (50 mg total) by mouth at bedtime. 30 capsule 11  . HYDROCHLOROTHIAZIDE 25 MG PO TABS Oral Take 25 mg by mouth daily.      Marland Kitchen HYDROXYZINE PAMOATE 25 MG PO CAPS Oral Take 1 capsule (25 mg total) by mouth every 6 (six) hours as needed. itching 30 capsule 11  . LAMOTRIGINE 200 MG PO TABS Oral Take 200 mg by mouth daily.      Marland Kitchen LOPERAMIDE HCL 2 MG PO CAPS Oral Take 2 mg by mouth 4  (four) times daily as needed. Diarrhea/loose stools    . LORATADINE 10 MG PO TABS Oral Take 10 mg by mouth daily.      Marland Kitchen NAPROXEN SODIUM 220 MG PO TABS Oral Take 2 tablets (440 mg total) by mouth 2 (two) times daily with a meal. 60 tablet 11  . OLANZAPINE 10 MG PO TABS  Take one half tablet twice daily by mouth     . OLANZAPINE 20 MG PO TABS Oral Take 10 mg by mouth 2 (two) times daily.      Marland Kitchen OMEPRAZOLE 20 MG PO CPDR Oral Take 20 mg by mouth daily.      Marland Kitchen PAROXETINE HCL 40 MG PO TABS Oral Take 40 mg by mouth every morning.      Marland Kitchen NATALCARE PIC 60-1 MG PO TABS Oral Take 1 tablet by mouth daily with breakfast.      . RIVASTIGMINE 4.6 MG/24HR TD PT24 Transdermal Place 1 patch onto the skin daily.      . SODIUM FLUORIDE 1.1 % DT GEL dental Place onto teeth at bedtime.      Marland Kitchen TEMAZEPAM 15 MG PO CAPS Oral Take 1 capsule (15 mg total) by mouth at bedtime as needed. 30 capsule 5    I called in to [facility] .  Marland Kitchen TRAVOPROST 0.004 % OP SOLN Both Eyes Place 1 drop into both eyes daily.     . TRAZODONE HCL 100 MG PO TABS Oral Take 1 tablet (100 mg total) by mouth 2 (two) times daily. 60 tablet 11    Please cancel the order for #30 x 11 rf.  Thanks.  . TRAZODONE HCL 50 MG PO TABS Oral Take 50 mg by mouth daily.     . TROLAMINE SALICYLATE 10 % EX CREA Topical Apply topically 3 (three) times daily as needed. 85 g 3  . VALSARTAN 160 MG PO TABS Oral Take 160 mg by mouth 2 (two) times daily.        BP 126/68  Pulse 76  Temp(Src) 98.3 F (36.8 C) (Oral)  Resp 20  SpO2 99%  Physical Exam  Nursing note and vitals reviewed. Constitutional: She is oriented to person, place, and time. She appears well-developed and  well-nourished.  HENT:  Head: Normocephalic.  Mouth/Throat: Oropharynx is clear and moist.       Small abrasion/contusion to the right side of the parietal occipital area. There is no surrounding swelling, no step off deformity. No facial trauma or contusions. No battle signs or raccoon's eyes.  No laceration or bleeding.  Eyes: Conjunctivae and EOM are normal. Pupils are equal, round, and reactive to light.  Neck: Neck supple.  Cardiovascular: Normal rate and regular rhythm.  Exam reveals no gallop and no friction rub.   No murmur heard. Pulmonary/Chest: Breath sounds normal. She has no wheezes. She has no rales. She exhibits no tenderness.  Abdominal: Soft. Bowel sounds are normal. She exhibits no distension. There is no tenderness. There is no rebound and no guarding.  Musculoskeletal: Normal range of motion.       Scattered old bruises to the extremities. No acute trauma.  Neurological: She is alert and oriented to person, place, and time. No cranial nerve deficit. Coordination normal.  Skin: Skin is warm and dry. No rash noted.  Psychiatric: She has a normal mood and affect.    ED Course  Procedures (including critical care time)  Labs Reviewed - No data to display No results found.   1. Fall   2. Scalp contusion       MDM  Pt is seen and examined;  Initial history and physical completed.  Will follow.     Pt is seen and examined;  Initial history and physical completed.  Will follow.      Patient was seen and evaluated. Very minor abrasion to the scalp. Patient is not on blood thinners. No additional imaging or labs indicated at this time. Was given instructions and followup care    Reynold Mantell A. Patrica Duel, MD 06/28/11 1137  Raman Featherston A. Patrica Duel, MD 06/28/11 1137

## 2011-07-03 ENCOUNTER — Other Ambulatory Visit: Payer: Self-pay

## 2011-07-03 NOTE — Telephone Encounter (Signed)
Request from [facility] for Tussin 100 mg/41ml liquid (Robitussin); Take 5 mls by mouth every 4 hours as needed for cough.  Pls advise.

## 2011-07-04 NOTE — Telephone Encounter (Signed)
Call this in please, 240 ml with 2 rf

## 2011-07-05 MED ORDER — GUAIFENESIN 100 MG/5ML PO LIQD: 200.0000 mg | Freq: Three times a day (TID) | ORAL | Status: AC | PRN

## 2011-07-05 NOTE — Telephone Encounter (Signed)
Rx faxed back to pharmacy

## 2011-07-09 ENCOUNTER — Encounter: Payer: Self-pay | Admitting: Family Medicine

## 2011-07-16 ENCOUNTER — Telehealth: Payer: Self-pay | Admitting: Family Medicine

## 2011-07-16 NOTE — Telephone Encounter (Signed)
[employer] has changed the dosage of pt.'s  medication (Alprazolam) and pt.'s sister states that pt is acting differently. Thinks they are limiting her per day amount. Pt. Is having trouble focusing & has shakiness in hands.

## 2011-07-16 NOTE — Telephone Encounter (Signed)
[employer] has nothing to do with her dosage. Please call the facility and ask exactly what she is getting

## 2011-07-17 NOTE — Telephone Encounter (Signed)
I spoke with the nurse at [facility] that is taking care of the pt. She said that there have not been any dose changes in pt's medication. I called the sister Hildred Alamin at [phone] and left the above information.

## 2011-07-31 ENCOUNTER — Telehealth: Payer: Self-pay | Admitting: Family Medicine

## 2011-07-31 NOTE — Telephone Encounter (Signed)
Refill request for Hydrocortisone Carton 1%  ( Hytone ) apply to affected area tid prn. ( this was a fax and came in on 07/29/11 ).

## 2011-08-01 MED ORDER — HYDROCORTISONE 1 % EX OINT: TOPICAL_OINTMENT | Freq: Three times a day (TID) | CUTANEOUS | Status: DC

## 2011-08-01 NOTE — Telephone Encounter (Signed)
Dr. Clent Ridges did approve this script and I faxed to [facility].

## 2011-08-07 ENCOUNTER — Telehealth: Payer: Self-pay | Admitting: Family Medicine

## 2011-08-07 ENCOUNTER — Other Ambulatory Visit (HOSPITAL_COMMUNITY): Payer: Self-pay | Admitting: Internal Medicine

## 2011-08-07 ENCOUNTER — Other Ambulatory Visit: Payer: Self-pay | Admitting: *Deleted

## 2011-08-07 MED ORDER — CARVEDILOL 6.25 MG PO TABS: 6.2500 mg | ORAL_TABLET | Freq: Two times a day (BID) | ORAL | Status: DC

## 2011-08-07 NOTE — Telephone Encounter (Signed)
Request from pharmacy to change the way the Xanax is written. Dr. Clent Ridges did approve the changes- at 5 pm take a 1 mg and at bedtime take a 2 mg dose. I put new order in pt's chart and faxed over a copy to [facility].

## 2011-08-22 ENCOUNTER — Emergency Department (INDEPENDENT_AMBULATORY_CARE_PROVIDER_SITE_OTHER): Payer: Medicare PPO

## 2011-08-22 ENCOUNTER — Encounter (HOSPITAL_BASED_OUTPATIENT_CLINIC_OR_DEPARTMENT_OTHER): Payer: Self-pay

## 2011-08-22 ENCOUNTER — Emergency Department (HOSPITAL_BASED_OUTPATIENT_CLINIC_OR_DEPARTMENT_OTHER)
Admission: EM | Admit: 2011-08-22 | Discharge: 2011-08-22 | Disposition: A | Discharge status: Home Health | Payer: Medicare PPO | Attending: Emergency Medicine | Admitting: Emergency Medicine

## 2011-08-22 DIAGNOSIS — I509 Heart failure, unspecified: Secondary | ICD-10-CM | POA: Insufficient documentation

## 2011-08-22 DIAGNOSIS — S0190XA Unspecified open wound of unspecified part of head, initial encounter: Secondary | ICD-10-CM

## 2011-08-22 DIAGNOSIS — G20 Parkinson's disease: Secondary | ICD-10-CM | POA: Insufficient documentation

## 2011-08-22 DIAGNOSIS — R51 Headache: Secondary | ICD-10-CM | POA: Insufficient documentation

## 2011-08-22 DIAGNOSIS — S0081XA Abrasion of other part of head, initial encounter: Secondary | ICD-10-CM

## 2011-08-22 DIAGNOSIS — E039 Hypothyroidism, unspecified: Secondary | ICD-10-CM | POA: Insufficient documentation

## 2011-08-22 DIAGNOSIS — Y9210 Unspecified residential institution as the place of occurrence of the external cause: Secondary | ICD-10-CM | POA: Insufficient documentation

## 2011-08-22 DIAGNOSIS — K219 Gastro-esophageal reflux disease without esophagitis: Secondary | ICD-10-CM | POA: Insufficient documentation

## 2011-08-22 DIAGNOSIS — R296 Repeated falls: Secondary | ICD-10-CM | POA: Insufficient documentation

## 2011-08-22 DIAGNOSIS — H409 Unspecified glaucoma: Secondary | ICD-10-CM | POA: Insufficient documentation

## 2011-08-22 DIAGNOSIS — I10 Essential (primary) hypertension: Secondary | ICD-10-CM | POA: Insufficient documentation

## 2011-08-22 DIAGNOSIS — F0390 Unspecified dementia without behavioral disturbance: Secondary | ICD-10-CM

## 2011-08-22 DIAGNOSIS — Y92129 Unspecified place in nursing home as the place of occurrence of the external cause: Secondary | ICD-10-CM

## 2011-08-22 DIAGNOSIS — IMO0002 Reserved for concepts with insufficient information to code with codable children: Secondary | ICD-10-CM | POA: Insufficient documentation

## 2011-08-22 DIAGNOSIS — J45909 Unspecified asthma, uncomplicated: Secondary | ICD-10-CM | POA: Insufficient documentation

## 2011-08-22 DIAGNOSIS — W19XXXA Unspecified fall, initial encounter: Secondary | ICD-10-CM

## 2011-08-22 DIAGNOSIS — G20A1 Parkinson's disease without dyskinesia, without mention of fluctuations: Secondary | ICD-10-CM | POA: Insufficient documentation

## 2011-08-22 NOTE — ED Notes (Signed)
Pt c/o facial pain following fall at around 315 this morning.  Pt states she was walking to restroom and fell on her way back to bed.  Pt denies LOC.  No other pain reported.

## 2011-08-22 NOTE — ED Provider Notes (Signed)
I saw and evaluated the patient, reviewed the resident's note and I agree with the findings and plan. Patient with mechanical fall and injury to the frontal part of her face and forehead. She states she was dazed for a few minutes but no true LOC. She denies any neck pain and only injuries to the face on exam. CT of the head is negative. Patient is able to ambulate in the department with her walker without difficulty. Will discharge   Lorraine Sprout, MD 08/22/11 1214

## 2011-08-22 NOTE — ED Provider Notes (Signed)
History     CSN: [id number]  Arrival date & time 08/22/11  4098   First MD Initiated Contact with Patient 08/22/11 0935      Chief Complaint  Patient presents with  . Fall   HPI pt is a 66 year old female who lives at an assisted living facility and fell this morning.  Pt reports that she fell while walking back from the restroom.  Did not black out or remember tripping but did hit her head/face on the right.  Felt a little bit spacy right after this but did not actually black out.  Was not going to go to doctor but sister insisted.  Patient does not report any visual changes, headache, numbness/tingling, or any mental status changes.  Pain is located in the right forehead and right cheek.  No radiation.  No other pain.  Sister reports that patient often has falls related to her severe bilateral knee arthritis and her knees catching.  Past Medical History  Diagnosis Date  . CHF (congestive heart failure)   . NICM (nonischemic cardiomyopathy)   . Mitral regurgitation     moderate - severe  . HTN (hypertension)   . HLD (hyperlipidemia)   . GERD (gastroesophageal reflux disease)   . Knee pain   . Anemia   . Dementia   . Glaucoma   . Osteoarthritis   . Hypothyroidism   . Colonic polyp   . Parkinson's disease     Sees Dr Louanna Raw at [facility]  . Depression     sees [name]  . Asthma     followed by [name]  . Nephrolithiasis   . Glaucoma     Past Surgical History  Procedure Date  . Cholecystectomy   . Tonsillectomy   . Cardiac catheterization 04/2007    normal coronaries  . Total knee arthroplasty     right. Synvisc injections to the knees    Family History  Problem Relation Age of Onset  . Arthritis    . Breast cancer    . Diabetes    . Hyperlipidemia    . Stroke    . Cancer Other     breast    History  Substance Use Topics  . Smoking status: Never Smoker   . Smokeless tobacco: Never Used  . Alcohol Use: No    OB History    Grav  Para Term Preterm Abortions TAB SAB Ect Mult Living                  Review of Systems  Constitutional: Negative.   HENT: Positive for facial swelling. Negative for ear pain, nosebleeds, sore throat, drooling, neck pain, neck stiffness, voice change and sinus pressure.   Eyes: Negative.   Respiratory: Negative.   Cardiovascular: Negative.   Gastrointestinal: Negative.   Genitourinary: Negative.   Musculoskeletal: Negative.   Neurological: Negative for dizziness, seizures, weakness, light-headedness, numbness and headaches.  Hematological: Negative.     Allergies  Donepezil hydrochloride; Erythromycin; and Tramadol  Home Medications   Current Outpatient Rx  Name Route Sig Dispense Refill  . ALPRAZOLAM 0.5 MG PO TABS Oral Take 0.5 mg by mouth daily. Take 0.5 mg po qam, take 0.5 mg po at noon, take a 1 mg dose at 5 pm and take a 2 mg dose qhs.    . ASPIRIN 81 MG PO TABS Oral Take 81 mg by mouth daily.      Marland Kitchen CALCIUM CARBONATE 600 MG PO TABS  Oral Take 1 tablet (600 mg total) by mouth 2 (two) times daily with a meal. 60 tablet 6  . CALCIUM CARBONATE-VITAMIN D 600-400 MG-UNIT PO TABS Oral Take 1 tablet by mouth 2 (two) times daily. 60 tablet 11  . CARBAMAZEPINE 200 MG PO TABS Oral Take 200 mg by mouth 3 (three) times daily.     Marland Kitchen CARBIDOPA-LEVODOPA 25-100 MG PO TABS Oral Take 1 tablet by mouth 3 (three) times daily.      Marland Kitchen CARVEDILOL 6.25 MG PO TABS Oral Take 1 tablet (6.25 mg total) by mouth 2 (two) times daily with a meal. 64 tablet 5  . CLONAZEPAM 1 MG PO TABS Oral Take 1 tablet (1 mg total) by mouth 2 (two) times daily as needed. 60 tablet 5    Called in to [facility] [phone]  . BIOTENE DRY MOUTH DT GEL dental Place onto teeth every 2 (two) hours as needed. Dry mouth     . DIPHENOXYLATE-ATROPINE 2.5-0.025 MG PO TABS Oral Take 1 tablet by mouth 3 (three) times daily as needed. significant loose stools     . DOXEPIN HCL 50 MG PO CAPS Oral Take 1 capsule (50 mg total) by mouth at  bedtime. 30 capsule 11  . HYDROCHLOROTHIAZIDE 25 MG PO TABS Oral Take 25 mg by mouth daily.      Marland Kitchen HYDROCORTISONE 1 % EX OINT Topical Apply topically 3 (three) times daily. 30 g 3  . HYDROXYZINE PAMOATE 25 MG PO CAPS Oral Take 1 capsule (25 mg total) by mouth every 6 (six) hours as needed. itching 30 capsule 11  . LAMOTRIGINE 200 MG PO TABS Oral Take 200 mg by mouth daily.      Marland Kitchen LOPERAMIDE HCL 2 MG PO CAPS Oral Take 2 mg by mouth 4 (four) times daily as needed. Diarrhea/loose stools    . LORATADINE 10 MG PO TABS Oral Take 10 mg by mouth daily.      Marland Kitchen NAPROXEN SODIUM 220 MG PO TABS Oral Take 2 tablets (440 mg total) by mouth 2 (two) times daily with a meal. 60 tablet 11  . OLANZAPINE 10 MG PO TABS  Take one half tablet twice daily by mouth     . OLANZAPINE 20 MG PO TABS Oral Take 10 mg by mouth 2 (two) times daily.      Marland Kitchen OMEPRAZOLE 20 MG PO CPDR Oral Take 20 mg by mouth daily.      Marland Kitchen PAROXETINE HCL 40 MG PO TABS Oral Take 40 mg by mouth every morning.      Marland Kitchen NATALCARE PIC 60-1 MG PO TABS Oral Take 1 tablet by mouth daily with breakfast.      . RIVASTIGMINE 4.6 MG/24HR TD PT24 Transdermal Place 1 patch onto the skin daily.      . SODIUM FLUORIDE 1.1 % DT GEL dental Place onto teeth at bedtime.      Marland Kitchen TEMAZEPAM 15 MG PO CAPS Oral Take 1 capsule (15 mg total) by mouth at bedtime as needed. 30 capsule 5    I called in to [facility] .  Marland Kitchen TRAVOPROST 0.004 % OP SOLN Both Eyes Place 1 drop into both eyes daily.     . TRAZODONE HCL 100 MG PO TABS Oral Take 1 tablet (100 mg total) by mouth 2 (two) times daily. 60 tablet 11    Please cancel the order for #30 x 11 rf.  Thanks.  . TRAZODONE HCL 50 MG PO TABS Oral Take 50 mg by  mouth daily.     . TROLAMINE SALICYLATE 10 % EX CREA Topical Apply topically 3 (three) times daily as needed. 85 g 3  . VALSARTAN 160 MG PO TABS Oral Take 160 mg by mouth 2 (two) times daily.        BP 141/60  Pulse 76  Temp(Src) 98.7 F (37.1 C) (Oral)  Resp 16  Ht 5\' 6"   (1.676 m)  Wt 187 lb (84.823 kg)  BMI 30.18 kg/m2  SpO2 98%  Physical Exam  Constitutional: She is oriented to person, place, and time. She appears well-developed and well-nourished. No distress.  HENT:  Head:    Eyes: Conjunctivae and EOM are normal. Pupils are equal, round, and reactive to light.  Neck: Normal range of motion. Neck supple.  Cardiovascular: Normal rate, regular rhythm and normal heart sounds.   Pulmonary/Chest: Effort normal and breath sounds normal.  Abdominal: Soft.  Musculoskeletal: Normal range of motion.  Neurological: She is alert and oriented to person, place, and time. No cranial nerve deficit. Coordination normal.  Skin: Skin is warm and dry.  Psychiatric: She has a normal mood and affect.    ED Course  Procedures (including critical care time)  Labs Reviewed - No data to display Ct Head Wo Contrast  08/22/2011  *RADIOLOGY REPORT*  Clinical Data: Fall, laceration, dementia  CT HEAD WITHOUT CONTRAST  Technique:  Contiguous axial images were obtained from the base of the skull through the vertex without contrast.  Comparison: Nov 21, 2010  Findings: The ventricles are normal in size, shape, and position. There is no mass effect or midline shift.  No acute hemorrhage or abnormal extra-axial fluid collections are identified.  The gray/white differentiation is normal.   There are carotid siphon calcifications. The orbits, calvarium, visualized paranasal sinuses have a normal appearance.  IMPRESSION: Stable CT scan of the head with no evidence of acute intracranial abnormality.  Original Report Authenticated By: Brandon Melnick, M.D.     1. Abrasion, face w/o infection   2. Fall at nursing home       MDM  No evidence of subdural or other bleed.  Will treat symptomatically and d/c home.        Majel Homer, MD 08/22/11 1116

## 2011-08-22 NOTE — ED Notes (Signed)
Pt ambulated to restroom using walker, steady gait.

## 2011-08-22 NOTE — Discharge Instructions (Signed)
You should take tylenol for your discomfort.  You do not have any bleeding in your head. Come back to the ER if you have problems with headaches, changes in your vision, or worsening pain in your face.

## 2011-09-09 ENCOUNTER — Telehealth: Payer: Self-pay | Admitting: Family Medicine

## 2011-09-09 MED ORDER — DOXYCYCLINE HYCLATE 100 MG PO TABS: 100.0000 mg | ORAL_TABLET | Freq: Two times a day (BID) | ORAL | Status: AC

## 2011-09-09 NOTE — Telephone Encounter (Signed)
Pt has 2 boils under right armpit, red swollen and one has a white head. Please advise? Dr. Clent Ridges wrote for Doxycycline 100 mg take 1 po bid for 14 days. I faxed order to [facility] [phone] ) and I put order in computer.

## 2011-10-01 ENCOUNTER — Telehealth: Payer: Self-pay | Admitting: Family Medicine

## 2011-10-01 NOTE — Telephone Encounter (Signed)
Refill request for Alprazolam 0.5 mg, 1 mg, & 2 mg and send to [facility] phone # is [phone] and fax # is [phone].

## 2011-10-02 NOTE — Telephone Encounter (Signed)
Call in a 6 month supply  

## 2011-10-03 MED ORDER — ALPRAZOLAM 0.5 MG PO TABS: ORAL_TABLET | ORAL | Status: DC

## 2011-10-03 MED ORDER — ALPRAZOLAM 1 MG PO TABS: ORAL_TABLET | ORAL | Status: DC

## 2011-10-03 MED ORDER — ALPRAZOLAM 2 MG PO TABS: 2.0000 mg | ORAL_TABLET | Freq: Every day | ORAL | Status: DC

## 2011-10-03 NOTE — Telephone Encounter (Signed)
I faxed all 3 scripts and it was for a 90 day supply.

## 2011-10-07 ENCOUNTER — Telehealth: Payer: Self-pay | Admitting: Family Medicine

## 2011-10-07 NOTE — Telephone Encounter (Signed)
Scheduled for 9:30am tomorrow

## 2011-10-07 NOTE — Telephone Encounter (Signed)
Pts sister called and said that pt has a huge ulcerated sore on abdomen that ? May be a boil. req work in ov with [name] tomorrow asap.

## 2011-10-08 ENCOUNTER — Encounter: Payer: Self-pay | Admitting: Family Medicine

## 2011-10-08 ENCOUNTER — Ambulatory Visit (INDEPENDENT_AMBULATORY_CARE_PROVIDER_SITE_OTHER): Payer: Medicare PPO | Admitting: Family Medicine

## 2011-10-08 ENCOUNTER — Other Ambulatory Visit: Payer: Self-pay | Admitting: Family Medicine

## 2011-10-08 VITALS — BP 128/80 | HR 79 | Temp 97.4°F | Wt 179.0 lb

## 2011-10-08 DIAGNOSIS — G20 Parkinson's disease: Secondary | ICD-10-CM

## 2011-10-08 DIAGNOSIS — F0280 Dementia in other diseases classified elsewhere without behavioral disturbance: Secondary | ICD-10-CM

## 2011-10-08 DIAGNOSIS — L0292 Furuncle, unspecified: Secondary | ICD-10-CM

## 2011-10-08 DIAGNOSIS — G3183 Dementia with Lewy bodies: Secondary | ICD-10-CM

## 2011-10-08 DIAGNOSIS — L309 Dermatitis, unspecified: Secondary | ICD-10-CM

## 2011-10-08 DIAGNOSIS — L259 Unspecified contact dermatitis, unspecified cause: Secondary | ICD-10-CM

## 2011-10-08 MED ORDER — DOXYCYCLINE HYCLATE 100 MG PO CAPS: 100.0000 mg | ORAL_CAPSULE | Freq: Two times a day (BID) | ORAL | Status: AC

## 2011-10-08 NOTE — Progress Notes (Signed)
  Subjective:    Patient ID: EVALUNA UTKE, female    DOB: September 06, 1945, 66 y.o.   MRN: [id number]  HPI Here with her sister for a boil on the abdomen which has been present for 2 weeks. No fever or systemic complaints. She is not sure if it has drained or not. Otherwise she is doing well. He has not seen Dr. Carlos Levering or Dr. Gala Romney for quite awhile. She is getting PT 3 times a week for strengthening and for balance. Her mental status seems stable per her sister.    Review of Systems  Constitutional: Negative.   Respiratory: Negative.   Cardiovascular: Negative.        Objective:   Physical Exam  Constitutional: She appears well-developed and well-nourished.  Neck: No JVD present. No thyromegaly present.  Cardiovascular: Normal rate, regular rhythm, normal heart sounds and intact distal pulses.   Pulmonary/Chest: Effort normal and breath sounds normal.  Lymphadenopathy:    She has no cervical adenopathy.  Skin:       The lower abdomen has a tender abscess about 2 cm in diameter. I attempted to lance this with a scalpel but only a small amount of fluid could be extracted. This has obviously come to a head and drained a few days ago.           Assessment & Plan:  This is a boil which we will treat with 2 weeks of Doxycycline. Dress with gauze daily. A wound culture was obtained and sent off. She seems to be doing quite well in general. I asked her sister to get appointments with Dr. Carlos Levering and Dr. Gala Romney soon.

## 2011-10-11 LAB — WOUND CULTURE: Gram Stain: NONE SEEN

## 2011-10-14 NOTE — Progress Notes (Signed)
Quick Note:  Spoke with Royanne Foots, nurse and gave results. ______

## 2011-10-15 ENCOUNTER — Telehealth: Payer: Self-pay | Admitting: Family Medicine

## 2011-10-15 NOTE — Telephone Encounter (Signed)
Need to get a Verbal order for PT and OT re: wound on pts abd. Pt is being discharged from Out Patient and will need to continue therapy under home health.

## 2011-10-15 NOTE — Telephone Encounter (Signed)
Spoke with [employer] ( home health nurse ) and gave below verbal orders.

## 2011-10-15 NOTE — Telephone Encounter (Signed)
Okay for order to continue OT and PT

## 2011-10-18 ENCOUNTER — Other Ambulatory Visit: Payer: Self-pay

## 2011-10-18 MED ORDER — ASPIRIN 81 MG PO TABS: 81.0000 mg | ORAL_TABLET | Freq: Every day | ORAL | Status: DC

## 2011-10-23 DIAGNOSIS — L02219 Cutaneous abscess of trunk, unspecified: Secondary | ICD-10-CM

## 2011-10-23 DIAGNOSIS — G3183 Dementia with Lewy bodies: Secondary | ICD-10-CM

## 2011-10-23 DIAGNOSIS — L03319 Cellulitis of trunk, unspecified: Secondary | ICD-10-CM

## 2011-10-23 DIAGNOSIS — I509 Heart failure, unspecified: Secondary | ICD-10-CM

## 2011-10-23 DIAGNOSIS — F0280 Dementia in other diseases classified elsewhere without behavioral disturbance: Secondary | ICD-10-CM

## 2011-10-23 DIAGNOSIS — G20 Parkinson's disease: Secondary | ICD-10-CM

## 2011-10-28 ENCOUNTER — Telehealth: Payer: Self-pay | Admitting: Family Medicine

## 2011-10-28 NOTE — Telephone Encounter (Signed)
Refill request for Sinemet 25/100 mg take 1 po tid and send to [facility] fax # [phone] ( 90 day supply )

## 2011-10-28 NOTE — Telephone Encounter (Signed)
I faxed request back to pharmacy with the below message on it.

## 2011-10-28 NOTE — Telephone Encounter (Signed)
She gets this from Dr. Carlos Levering at [facility]

## 2011-11-12 ENCOUNTER — Encounter: Payer: Self-pay | Admitting: Family Medicine

## 2011-11-12 ENCOUNTER — Ambulatory Visit (INDEPENDENT_AMBULATORY_CARE_PROVIDER_SITE_OTHER): Payer: Medicare PPO | Admitting: Family Medicine

## 2011-11-12 VITALS — BP 158/92 | HR 75 | Temp 97.7°F | Wt 182.0 lb

## 2011-11-12 DIAGNOSIS — L0293 Carbuncle, unspecified: Secondary | ICD-10-CM

## 2011-11-12 MED ORDER — DOXYCYCLINE HYCLATE 100 MG PO CAPS: 100.0000 mg | ORAL_CAPSULE | Freq: Two times a day (BID) | ORAL | Status: AC

## 2011-11-12 NOTE — Progress Notes (Signed)
  Subjective:    Patient ID: Lorraine [name], female    DOB: 04/19/1946, 66 y.o.   MRN: [id number]  HPI Here for another boil which appeared on her left chest about one week ago. It is tender and draining. She had a boil lanced on the abdomen one month ago, and the culture grew MRSA sensitive to Doxycyline. She was given 2 weeks of this. No fever, and she feels well otherwise.    Review of Systems  Constitutional: Negative.   Skin: Positive for wound.       Objective:   Physical Exam  Constitutional: She appears well-developed and well-nourished.  Cardiovascular: Normal rate, regular rhythm, normal heart sounds and intact distal pulses.   Pulmonary/Chest: Effort normal and breath sounds normal.  Skin:       The original boil on the right lower abdomen is healing nicely. 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 The new boil was lanced today with return of purulent fluid. It was dressed with Neosporin and gauze. We will treat with Doxycyline for 60 days this time. I also instructed the staff at her living facility to examine the other residents closely for rashes or boils.

## 2011-11-15 ENCOUNTER — Telehealth: Payer: Self-pay | Admitting: Family Medicine

## 2011-11-15 MED ORDER — CALCIUM CARBONATE 600 MG PO TABS: 600.0000 mg | ORAL_TABLET | Freq: Two times a day (BID) | ORAL | Status: DC

## 2011-11-15 NOTE — Telephone Encounter (Signed)
I sent script e-scribe. 

## 2011-12-09 ENCOUNTER — Telehealth: Payer: Self-pay | Admitting: Family Medicine

## 2011-12-09 MED ORDER — OMEPRAZOLE 20 MG PO CPDR: 20.0000 mg | DELAYED_RELEASE_CAPSULE | Freq: Every day | ORAL | Status: DC

## 2011-12-09 NOTE — Telephone Encounter (Signed)
Refill request for Prilosec 20 mg and I did send e-scribe to [facility].

## 2011-12-13 ENCOUNTER — Telehealth: Payer: Self-pay | Admitting: Family Medicine

## 2011-12-13 MED ORDER — PRENATAL 27-0.8 MG PO TABS: 1.0000 | ORAL_TABLET | Freq: Every day | ORAL | Status: DC

## 2011-12-13 MED ORDER — HYDROCHLOROTHIAZIDE 25 MG PO TABS: 25.0000 mg | ORAL_TABLET | Freq: Every day | ORAL | Status: DC

## 2011-12-13 NOTE — Telephone Encounter (Signed)
I sent script e-scribe for HCTZ.

## 2011-12-13 NOTE — Telephone Encounter (Signed)
I sent script e-scribe. 

## 2011-12-16 ENCOUNTER — Encounter (HOSPITAL_BASED_OUTPATIENT_CLINIC_OR_DEPARTMENT_OTHER): Payer: Self-pay | Admitting: Family Medicine

## 2011-12-16 ENCOUNTER — Emergency Department (HOSPITAL_BASED_OUTPATIENT_CLINIC_OR_DEPARTMENT_OTHER)
Admission: EM | Admit: 2011-12-16 | Discharge: 2011-12-16 | Disposition: A | Discharge status: Home / Self Care | Payer: Medicare PPO | Attending: Emergency Medicine | Admitting: Emergency Medicine

## 2011-12-16 DIAGNOSIS — I10 Essential (primary) hypertension: Secondary | ICD-10-CM | POA: Insufficient documentation

## 2011-12-16 DIAGNOSIS — S0101XA Laceration without foreign body of scalp, initial encounter: Secondary | ICD-10-CM

## 2011-12-16 DIAGNOSIS — W19XXXA Unspecified fall, initial encounter: Secondary | ICD-10-CM | POA: Insufficient documentation

## 2011-12-16 DIAGNOSIS — F0280 Dementia in other diseases classified elsewhere without behavioral disturbance: Secondary | ICD-10-CM | POA: Insufficient documentation

## 2011-12-16 DIAGNOSIS — Y929 Unspecified place or not applicable: Secondary | ICD-10-CM | POA: Insufficient documentation

## 2011-12-16 DIAGNOSIS — S0100XA Unspecified open wound of scalp, initial encounter: Secondary | ICD-10-CM | POA: Insufficient documentation

## 2011-12-16 LAB — URINALYSIS, ROUTINE W REFLEX MICROSCOPIC
Bilirubin Urine: NEGATIVE
Glucose, UA: NEGATIVE mg/dL
Hgb urine dipstick: NEGATIVE
Ketones, ur: NEGATIVE mg/dL
Leukocytes, UA: NEGATIVE
Nitrite: NEGATIVE
Protein, ur: NEGATIVE mg/dL
Specific Gravity, Urine: 1.013 (ref 1.005–1.030)
Urobilinogen, UA: 0.2 mg/dL (ref 0.0–1.0)
pH: 7 (ref 5.0–8.0)

## 2011-12-16 NOTE — ED Notes (Addendum)
Pt from [facility]. Pt brought via [facility] for fall. Pt hit head and has small laceration to left side of head and back of head. Pt is on plavix and daily ASA. Pt denies neck and back pain. "Nursing home requesting pt be checked for uti" per [facility].

## 2011-12-16 NOTE — ED Notes (Signed)
[facility] nurse called requesting copy of ua results to be faxed.  Copy faxed.

## 2011-12-16 NOTE — ED Notes (Signed)
Patient is resting comfortably. 

## 2011-12-16 NOTE — ED Provider Notes (Signed)
History     CSN: [id number]  Arrival date & time 12/16/11  1239   First MD Initiated Contact with Patient 12/16/11 1408      Chief Complaint  Patient presents with  . Fall    (Consider location/radiation/quality/duration/timing/severity/associated sxs/prior treatment) Patient is a 66 y.o. female presenting with fall. The history is provided by the patient. No language interpreter was used.  Fall The accident occurred less than 1 hour ago. The fall occurred while walking. She fell from a height of 1 to 2 ft. The volume of blood lost was minimal. The point of impact was the head. The pain is at a severity of 3/10. The pain is mild. There was no entrapment after the fall. Pertinent negatives include no visual change and no hearing loss. She has tried nothing for the symptoms. The treatment provided no relief.  Pt fell and hit head.  Pt has a laceration to the back of her head  Past Medical History  Diagnosis Date  . CHF (congestive heart failure)   . NICM (nonischemic cardiomyopathy)     sees Dr. Gala Romney   . Mitral regurgitation     moderate - severe  . HTN (hypertension)   . HLD (hyperlipidemia)   . GERD (gastroesophageal reflux disease)   . Knee pain   . Anemia   . Dementia   . Glaucoma   . Osteoarthritis   . Hypothyroidism   . Colonic polyp   . Parkinson's disease     Sees [name] at [facility]  . Depression     sees [name]  . Asthma     followed by [name]  . Nephrolithiasis   . Glaucoma     Past Surgical History  Procedure Date  . Cholecystectomy   . Tonsillectomy   . Cardiac catheterization 04/2007    normal coronaries  . Total knee arthroplasty     right. Synvisc injections to the knees    Family History  Problem Relation Age of Onset  . Arthritis    . Breast cancer    . Diabetes    . Hyperlipidemia    . Stroke    . Cancer Other     breast    History  Substance Use Topics  . Smoking status: Never Smoker   . Smokeless  tobacco: Never Used  . Alcohol Use: No    OB History    Grav Para Term Preterm Abortions TAB SAB Ect Mult Living                  Review of Systems  Skin: Positive for wound.  All other systems reviewed and are negative.    Allergies  Donepezil hydrochloride; Erythromycin; and Tramadol  Home Medications   Current Outpatient Rx  Name Route Sig Dispense Refill  . ALPRAZOLAM 0.5 MG PO TABS  Take 1 tablet at 8am and take 1 tablet at noon 180 tablet 1  . ALPRAZOLAM 1 MG PO TABS  Take 1 tablet at 5pm 90 tablet 1  . ALPRAZOLAM 2 MG PO TABS Oral Take 1 tablet (2 mg total) by mouth at bedtime. 90 tablet 1  . ASPIRIN 81 MG PO TABS Oral Take 1 tablet (81 mg total) by mouth daily. 30 tablet 11  . CALCIUM CARBONATE 600 MG PO TABS Oral Take 1 tablet (600 mg total) by mouth 2 (two) times daily with a meal. 60 tablet 11  . CALCIUM CARBONATE-VITAMIN D 600-400 MG-UNIT PO TABS Oral Take  1 tablet by mouth 2 (two) times daily. 60 tablet 11  . CARBAMAZEPINE 200 MG PO TABS Oral Take 200 mg by mouth 3 (three) times daily.     Marland Kitchen CARBIDOPA-LEVODOPA 25-100 MG PO TABS Oral Take 1 tablet by mouth 3 (three) times daily.      Marland Kitchen CARVEDILOL 6.25 MG PO TABS Oral Take 1 tablet (6.25 mg total) by mouth 2 (two) times daily with a meal. 64 tablet 5  . CLONAZEPAM 1 MG PO TABS Oral Take 1 tablet (1 mg total) by mouth 2 (two) times daily as needed. 60 tablet 5    Called in to [facility] [phone]  . BIOTENE DRY MOUTH DT GEL dental Place onto teeth every 2 (two) hours as needed. Dry mouth     . DIPHENOXYLATE-ATROPINE 2.5-0.025 MG PO TABS Oral Take 1 tablet by mouth 3 (three) times daily as needed. significant loose stools     . DOXEPIN HCL 50 MG PO CAPS Oral Take 1 capsule (50 mg total) by mouth at bedtime. 30 capsule 11  . HYDROCHLOROTHIAZIDE 25 MG PO TABS Oral Take 1 tablet (25 mg total) by mouth daily. 30 tablet 11  . HYDROCORTISONE 1 % EX OINT Topical Apply topically 3 (three) times daily. 30 g 3  . HYDROXYZINE  PAMOATE 25 MG PO CAPS Oral Take 1 capsule (25 mg total) by mouth every 6 (six) hours as needed. itching 30 capsule 11  . LAMOTRIGINE 200 MG PO TABS Oral Take 200 mg by mouth daily.      Marland Kitchen LOPERAMIDE HCL 2 MG PO CAPS Oral Take 2 mg by mouth 4 (four) times daily as needed. Diarrhea/loose stools    . LORATADINE 10 MG PO TABS Oral Take 10 mg by mouth daily.      Marland Kitchen NAPROXEN SODIUM 220 MG PO TABS Oral Take 2 tablets (440 mg total) by mouth 2 (two) times daily with a meal. 60 tablet 11  . OLANZAPINE 10 MG PO TABS  Take one half tablet twice daily by mouth     . OLANZAPINE 20 MG PO TABS Oral Take 10 mg by mouth 2 (two) times daily.      Marland Kitchen OMEPRAZOLE 20 MG PO CPDR Oral Take 1 capsule (20 mg total) by mouth daily. 30 capsule 11  . PAROXETINE HCL 40 MG PO TABS Oral Take 40 mg by mouth every morning.      Marland Kitchen PRENATAL 27-0.8 MG PO TABS Oral Take 1 tablet by mouth daily. 30 tablet 11  . NATALCARE PIC 60-1 MG PO TABS Oral Take 1 tablet by mouth daily with breakfast.      . RIVASTIGMINE 4.6 MG/24HR TD PT24 Transdermal Place 1 patch onto the skin daily.      . SODIUM FLUORIDE 1.1 % DT GEL dental Place onto teeth at bedtime.      Marland Kitchen TEMAZEPAM 15 MG PO CAPS Oral Take 1 capsule (15 mg total) by mouth at bedtime as needed. 30 capsule 5    I called in to [facility] .  Marland Kitchen TRAVOPROST 0.004 % OP SOLN Both Eyes Place 1 drop into both eyes daily.     . TRAZODONE HCL 100 MG PO TABS Oral Take 1 tablet (100 mg total) by mouth 2 (two) times daily. 60 tablet 11    Please cancel the order for #30 x 11 rf.  Thanks.  . TRAZODONE HCL 50 MG PO TABS Oral Take 50 mg by mouth daily.     . TROLAMINE SALICYLATE  10 % EX CREA Topical Apply topically 3 (three) times daily as needed. 85 g 3  . VALSARTAN 160 MG PO TABS Oral Take 160 mg by mouth 2 (two) times daily.        BP 123/65  Pulse 72  Temp 97.6 F (36.4 C) (Oral)  Resp 20  SpO2 100%  Physical Exam  Nursing note and vitals reviewed. Constitutional: She is oriented to person,  place, and time. She appears well-developed and well-nourished.  HENT:  Head: Normocephalic and atraumatic.  Right Ear: External ear normal.  Left Ear: External ear normal.  Eyes: Conjunctivae and EOM are normal. Pupils are equal, round, and reactive to light.  Cardiovascular: Normal rate.   Pulmonary/Chest: Effort normal and breath sounds normal.  Abdominal: Soft.  Musculoskeletal: Normal range of motion.  Neurological: She is alert and oriented to person, place, and time. She has normal reflexes.  Skin:       2 cm laceration occipital scalp  Psychiatric: She has a normal mood and affect.    ED Course  LACERATION REPAIR Date/Time: 12/16/2011 2:41 PM Performed by: Elson Areas Authorized by: Elson Areas Consent: Verbal consent not obtained. Risks and benefits: risks, benefits and alternatives were discussed Consent given by: patient Patient understanding: patient states understanding of the procedure being performed Time out: Immediately prior to procedure a "time out" was called to verify the correct patient, procedure, equipment, support staff and site/side marked as required. Body area: head/neck Location details: scalp Laceration length: 2 cm Foreign bodies: no foreign bodies Tendon involvement: none Nerve involvement: none Local anesthetic: lidocaine 2% with epinephrine Preparation: Patient was prepped and draped in the usual sterile fashion. Irrigation solution: saline Skin closure: staples Number of sutures: 3 Patient tolerance: Patient tolerated the procedure well with no immediate complications.   (including critical care time)   Labs Reviewed  URINALYSIS, ROUTINE W REFLEX MICROSCOPIC   No results found.   1. Laceration of scalp       MDM   Staple removal in 8 days.       Lonia Skinner [place], [place] 12/16/11 1443  Lonia Skinner [place], [place] 12/16/11 [phone]

## 2011-12-16 NOTE — Discharge Instructions (Signed)
Laceration Care, Adult °A laceration is a cut that goes through all layers of the skin. The cut goes into the tissue beneath the skin. °HOME CARE °For stitches (sutures) or staples: °· Keep the cut clean and dry.  °· If you have a bandage (dressing), change it at least once a day. Change the bandage if it gets wet or dirty, or as told by your doctor.  °· Wash the cut with soap and water 2 times a day. Rinse the cut with water. Pat it dry with a clean towel.  °· Put a thin layer of medicated cream on the cut as told by your doctor.  °· You may shower after the first 24 hours. Do not soak the cut in water until the stitches are removed.  °· Only take medicines as told by your doctor.  °· Have your stitches or staples removed as told by your doctor.  °For skin adhesive strips: °· Keep the cut clean and dry.  °· Do not get the strips wet. You may take a bath, but be careful to keep the cut dry.  °· If the cut gets wet, pat it dry with a clean towel.  °· The strips will fall off on their own. Do not remove the strips that are still stuck to the cut.  °For wound glue: °· You may shower or take baths. Do not soak or scrub the cut. Do not swim. Avoid heavy sweating until the glue falls off on its own. After a shower or bath, pat the cut dry with a clean towel.  °· Do not put medicine on your cut until the glue falls off.  °· If you have a bandage, do not put tape over the glue.  °· Avoid lots of sunlight or tanning lamps until the glue falls off. Put sunscreen on the cut for the first year to reduce your scar.  °· The glue will fall off on its own. Do not pick at the glue.  °You may need a tetanus shot if: °· You cannot remember when you had your last tetanus shot.  °· You have never had a tetanus shot.  °If you need a tetanus shot and you choose not to have one, you may get tetanus. Sickness from tetanus can be serious. °GET HELP RIGHT AWAY IF:  °· Your pain does not get better with medicine.  °· Your arm, hand, leg, or  foot loses feeling (numbness) or changes color.  °· Your cut is bleeding.  °· Your joint feels weak, or you cannot use your joint.  °· You have painful lumps on your body.  °· Your cut is red, puffy (swollen), or painful.  °· You have a red line on the skin near the cut.  °· You have yellowish-white fluid (pus) coming from the cut.  °· You have a fever.  °· You have a bad smell coming from the cut or bandage.  °· Your cut breaks open before or after stitches are removed.  °· You notice something coming out of the cut, such as wood or glass.  °· You cannot move a finger or toe.  °MAKE SURE YOU:  °· Understand these instructions.  °· Will watch your condition.  °· Will get help right away if you are not doing well or get worse.  °Document Released: 11/27/2007 Document Revised: 05/30/2011 Document Reviewed: 12/04/2010 °[employer]® Patient Information ©2012 [employer]. °

## 2011-12-16 NOTE — ED Notes (Signed)
Pt. Sister is going to take her back to [facility] facility after she takes her to lunch.  Pt. Sister reports to RN that she will go to [facility] home and get Pt. Walker before taking Pt. To lunch.  RN Earlene Plater educated Pt. Sister on the Pt. unstedy gait.  Pt. Sister will have help getting Pt. To her car [facility].

## 2011-12-16 NOTE — ED Provider Notes (Signed)
Medical screening examination/treatment/procedure(s) were performed by non-physician practitioner and as supervising physician I was immediately available for consultation/collaboration.    Gavin Pound. Bryon Parker, MD 12/16/11 1452

## 2011-12-20 ENCOUNTER — Encounter: Payer: Self-pay | Admitting: Family Medicine

## 2011-12-27 ENCOUNTER — Encounter: Payer: Self-pay | Admitting: Family

## 2011-12-27 ENCOUNTER — Ambulatory Visit (INDEPENDENT_AMBULATORY_CARE_PROVIDER_SITE_OTHER): Payer: Medicare PPO | Admitting: Family

## 2011-12-27 VITALS — BP 128/84 | HR 71 | Temp 98.2°F | Wt 172.0 lb

## 2011-12-27 DIAGNOSIS — Z4802 Encounter for removal of sutures: Secondary | ICD-10-CM

## 2011-12-27 NOTE — Progress Notes (Signed)
Subjective:    Patient ID: Lorraine [name], female    DOB: [birth year], 66 y.o.   MRN: [id number]  HPI 66 year old white female is into have staples removed from the crown of her head after a fall 10 days ago.she is unsure how she failed. She is a history of dementia and does not remember the fall.   Review of Systems  Constitutional: Negative.   Respiratory: Negative.   Cardiovascular: Negative.   Skin: Negative.        3 staples intact.  Neurological: Negative.  Negative for dizziness, light-headedness and headaches.   Past Medical History  Diagnosis Date  . CHF (congestive heart failure)   . NICM (nonischemic cardiomyopathy)     sees Dr. Gala Romney   . Mitral regurgitation     moderate - severe  . HTN (hypertension)   . HLD (hyperlipidemia)   . GERD (gastroesophageal reflux disease)   . Knee pain   . Anemia   . Dementia   . Glaucoma   . Osteoarthritis   . Hypothyroidism   . Colonic polyp   . Parkinson's disease     Sees [name] at [facility]  . Depression     sees [name]  . Asthma     followed by [name]  . Nephrolithiasis   . Glaucoma     History   Social History  . Marital Status: Divorced    Spouse Name: N/A    Number of Children: N/A  . Years of Education: N/A   Occupational History  . Not on file.   Social History Main Topics  . Smoking status: Never Smoker   . Smokeless tobacco: Never Used  . Alcohol Use: No  . Drug Use: No  . Sexually Active: Not on file   Other Topics Concern  . Not on file   Social History Narrative   Regular exercise: noPt lives with sister, Lorraine [name] - pt has dementia    Past Surgical History  Procedure Date  . Cholecystectomy   . Tonsillectomy   . Cardiac catheterization 04/2007    normal coronaries  . Total knee arthroplasty     right. Synvisc injections to the knees    Family History  Problem Relation Age of Onset  . Arthritis    . Breast cancer    . Diabetes    . Hyperlipidemia     . Stroke    . Cancer Other     breast    Allergies  Allergen Reactions  . Donepezil Hydrochloride     REACTION: nightmares  . Erythromycin   . Tramadol     REACTION: nightmares    Current Outpatient Prescriptions on File Prior to Visit  Medication Sig Dispense Refill  . ALPRAZolam (XANAX) 0.5 MG tablet Take 1 tablet at 8am and take 1 tablet at noon  180 tablet  1  . ALPRAZolam (XANAX) 1 MG tablet Take 1 tablet at 5pm  90 tablet  1  . alprazolam (XANAX) 2 MG tablet Take 1 tablet (2 mg total) by mouth at bedtime.  90 tablet  1  . aspirin 81 MG tablet Take 1 tablet (81 mg total) by mouth daily.  30 tablet  11  . calcium carbonate (OS-CAL) 600 MG TABS Take 1 tablet (600 mg total) by mouth 2 (two) times daily with a meal.  60 tablet  11  . Calcium Carbonate-Vitamin D 600-400 MG-UNIT per tablet Take 1 tablet by mouth 2 (two) times daily.  60 tablet  11  . carbamazepine (TEGRETOL) 200 MG tablet Take 200 mg by mouth 3 (three) times daily.       . carbidopa-levodopa (SINEMET) 25-100 MG per tablet Take 1 tablet by mouth 3 (three) times daily.        . carvedilol (COREG) 6.25 MG tablet Take 1 tablet (6.25 mg total) by mouth 2 (two) times daily with a meal.  64 tablet  5  . clonazePAM (KLONOPIN) 1 MG tablet Take 1 tablet (1 mg total) by mouth 2 (two) times daily as needed.  60 tablet  5  . Dentifrices (BIOTENE DRY MOUTH) GEL Place onto teeth every 2 (two) hours as needed. Dry mouth       . diphenoxylate-atropine (LOMOTIL) 2.5-0.025 MG per tablet Take 1 tablet by mouth 3 (three) times daily as needed. significant loose stools       . doxepin (SINEQUAN) 50 MG capsule Take 1 capsule (50 mg total) by mouth at bedtime.  30 capsule  11  . hydrochlorothiazide (HYDRODIURIL) 25 MG tablet Take 1 tablet (25 mg total) by mouth daily.  30 tablet  11  . hydrocortisone 1 % ointment Apply topically 3 (three) times daily.  30 g  3  . hydrOXYzine (VISTARIL) 25 MG capsule Take 1 capsule (25 mg total) by mouth  every 6 (six) hours as needed. itching  30 capsule  11  . lamoTRIgine (LAMICTAL) 200 MG tablet Take 200 mg by mouth daily.        Marland Kitchen loperamide (IMODIUM) 2 MG capsule Take 2 mg by mouth 4 (four) times daily as needed. Diarrhea/loose stools      . loratadine (CLARITIN) 10 MG tablet Take 10 mg by mouth daily.        . naproxen sodium (ANAPROX) 220 MG tablet Take 2 tablets (440 mg total) by mouth 2 (two) times daily with a meal.  60 tablet  11  . OLANZapine (ZYPREXA) 10 MG tablet Take one half tablet twice daily by mouth       . OLANZapine (ZYPREXA) 20 MG tablet Take 10 mg by mouth 2 (two) times daily.        Marland Kitchen omeprazole (PRILOSEC) 20 MG capsule Take 1 capsule (20 mg total) by mouth daily.  30 capsule  11  . PARoxetine (PAXIL) 40 MG tablet Take 40 mg by mouth every morning.        . Prenatal Vit-Fe Fumarate-FA (MULTIVITAMIN-PRENATAL) 27-0.8 MG TABS Take 1 tablet by mouth daily.  30 tablet  11  . Prenatal Vit-Fe Psac Cmplx-FA (PRENATAL MULTIVITAMIN) 60-1 MG tablet Take 1 tablet by mouth daily with breakfast.        . rivastigmine (EXELON) 4.6 mg/24hr Place 1 patch onto the skin daily.        . SODIUM FLUORIDE, DENTAL GEL, (SF) 1.1 % GEL Place onto teeth at bedtime.        . temazepam (RESTORIL) 15 MG capsule Take 1 capsule (15 mg total) by mouth at bedtime as needed.  30 capsule  5  . travoprost, benzalkonium, (TRAVATAN) 0.004 % ophthalmic solution Place 1 drop into both eyes daily.       . traZODone (DESYREL) 100 MG tablet Take 1 tablet (100 mg total) by mouth 2 (two) times daily.  60 tablet  11  . traZODone (DESYREL) 50 MG tablet Take 50 mg by mouth daily.       Marland Kitchen trolamine salicylate (ASPERCREME/ALOE) 10 % cream Apply topically 3 (three) times daily as needed.  85 g  3  . valsartan (DIOVAN) 160 MG tablet Take 160 mg by mouth 2 (two) times daily.          BP 128/84  Pulse 71  Temp 98.2 F (36.8 C) (Oral)  Wt 172 lb (78.019 kg)  SpO2 97%chart    Objective:   Physical Exam  Constitutional:  She is oriented to person, place, and time. She appears well-developed and well-nourished.  HENT:  Head: Normocephalic and atraumatic.  Right Ear: External ear normal.  Left Ear: External ear normal.  Cardiovascular: Normal rate, regular rhythm and normal heart sounds.   Pulmonary/Chest: Effort normal and breath sounds normal.  Neurological: She is alert and oriented to person, place, and time.  Skin: Skin is warm and dry.       3 staples removed from the crown of the head. It is well approximated.no drainage or signs of infection. Well healed.  Psychiatric: She has a normal mood and affect.          Assessment & Plan:  Assessment: Stable removal  Plan: Recheck with PCP as scheduled and when necessary.

## 2012-01-02 ENCOUNTER — Emergency Department (HOSPITAL_BASED_OUTPATIENT_CLINIC_OR_DEPARTMENT_OTHER)
Admission: EM | Admit: 2012-01-02 | Discharge: 2012-01-03 | Disposition: A | Discharge status: Home / Self Care | Payer: Medicare PPO | Attending: Emergency Medicine | Admitting: Emergency Medicine

## 2012-01-02 ENCOUNTER — Encounter (HOSPITAL_BASED_OUTPATIENT_CLINIC_OR_DEPARTMENT_OTHER): Payer: Self-pay | Admitting: *Deleted

## 2012-01-02 ENCOUNTER — Emergency Department (HOSPITAL_BASED_OUTPATIENT_CLINIC_OR_DEPARTMENT_OTHER): Payer: Medicare PPO

## 2012-01-02 DIAGNOSIS — K219 Gastro-esophageal reflux disease without esophagitis: Secondary | ICD-10-CM | POA: Insufficient documentation

## 2012-01-02 DIAGNOSIS — X58XXXA Exposure to other specified factors, initial encounter: Secondary | ICD-10-CM | POA: Insufficient documentation

## 2012-01-02 DIAGNOSIS — S0083XA Contusion of other part of head, initial encounter: Secondary | ICD-10-CM

## 2012-01-02 DIAGNOSIS — G20 Parkinson's disease: Secondary | ICD-10-CM | POA: Insufficient documentation

## 2012-01-02 DIAGNOSIS — E785 Hyperlipidemia, unspecified: Secondary | ICD-10-CM | POA: Insufficient documentation

## 2012-01-02 DIAGNOSIS — Z79899 Other long term (current) drug therapy: Secondary | ICD-10-CM | POA: Insufficient documentation

## 2012-01-02 DIAGNOSIS — Z9089 Acquired absence of other organs: Secondary | ICD-10-CM | POA: Insufficient documentation

## 2012-01-02 DIAGNOSIS — G20A1 Parkinson's disease without dyskinesia, without mention of fluctuations: Secondary | ICD-10-CM | POA: Insufficient documentation

## 2012-01-02 DIAGNOSIS — I509 Heart failure, unspecified: Secondary | ICD-10-CM | POA: Insufficient documentation

## 2012-01-02 DIAGNOSIS — F0390 Unspecified dementia without behavioral disturbance: Secondary | ICD-10-CM | POA: Insufficient documentation

## 2012-01-02 DIAGNOSIS — I10 Essential (primary) hypertension: Secondary | ICD-10-CM | POA: Insufficient documentation

## 2012-01-02 DIAGNOSIS — S1093XA Contusion of unspecified part of neck, initial encounter: Secondary | ICD-10-CM | POA: Insufficient documentation

## 2012-01-02 DIAGNOSIS — J45909 Unspecified asthma, uncomplicated: Secondary | ICD-10-CM | POA: Insufficient documentation

## 2012-01-02 DIAGNOSIS — S0003XA Contusion of scalp, initial encounter: Secondary | ICD-10-CM | POA: Insufficient documentation

## 2012-01-02 NOTE — ED Provider Notes (Signed)
History     CSN: [id number]  Arrival date & time 01/02/12  2024   First MD Initiated Contact with Patient 01/02/12 2236      Chief Complaint  Patient presents with  . Fall    (Consider location/radiation/quality/duration/timing/severity/associated sxs/prior treatment) Patient is a 66 y.o. female presenting with fall. The history is provided by the patient. No language interpreter was used.  Fall The accident occurred 3 to 5 hours ago. The point of impact was the head. The pain is present in the head. She was ambulatory at the scene. There was no alcohol use involved in the accident. She has tried nothing for the symptoms.  Pt lives in a memory care unit.  Pt here with her sister who is historian.  Sister picked pt up to take her to dinner and noticed a bruise and swelling to pt's right face.   Sister reports no one witness pt fall.  Pt does have frequent falls. (Sister reports no bruising last night)  Pt has been ambulating without difficulty Past Medical History  Diagnosis Date  . CHF (congestive heart failure)   . NICM (nonischemic cardiomyopathy)     sees Dr. Gala Romney   . Mitral regurgitation     moderate - severe  . HTN (hypertension)   . HLD (hyperlipidemia)   . GERD (gastroesophageal reflux disease)   . Knee pain   . Anemia   . Dementia   . Glaucoma   . Osteoarthritis   . Hypothyroidism   . Colonic polyp   . Parkinson's disease     Sees [name] at [facility]  . Depression     sees [name]  . Asthma     followed by [name]  . Nephrolithiasis   . Glaucoma     Past Surgical History  Procedure Date  . Cholecystectomy   . Tonsillectomy   . Cardiac catheterization 04/2007    normal coronaries  . Total knee arthroplasty     right. Synvisc injections to the knees    Family History  Problem Relation Age of Onset  . Arthritis    . Breast cancer    . Diabetes    . Hyperlipidemia    . Stroke    . Cancer Other     breast    History    Substance Use Topics  . Smoking status: Never Smoker   . Smokeless tobacco: Never Used  . Alcohol Use: No    OB History    Grav Para Term Preterm Abortions TAB SAB Ect Mult Living                  Review of Systems  HENT: Positive for facial swelling.   Skin: Positive for color change.  All other systems reviewed and are negative.    Allergies  Donepezil hydrochloride; Erythromycin; and Tramadol  Home Medications   Current Outpatient Rx  Name Route Sig Dispense Refill  . ALPRAZOLAM 0.5 MG PO TABS  Take 1 tablet at 8am and take 1 tablet at noon 180 tablet 1  . ALPRAZOLAM 1 MG PO TABS  Take 1 tablet at 5pm 90 tablet 1  . ALPRAZOLAM 2 MG PO TABS Oral Take 1 tablet (2 mg total) by mouth at bedtime. 90 tablet 1  . ASPIRIN 81 MG PO CHEW Oral Chew 81 mg by mouth daily.    Marland Kitchen CALCIUM CARBONATE 600 MG PO TABS Oral Take 1 tablet (600 mg total) by mouth 2 (two) times  daily with a meal. 60 tablet 11  . CALCIUM CARBONATE-VITAMIN D 600-400 MG-UNIT PO TABS Oral Take 1 tablet by mouth 2 (two) times daily. 60 tablet 11  . CARBAMAZEPINE ER 200 MG PO TB12 Oral Take 200 mg by mouth 3 (three) times daily.    Marland Kitchen CARBIDOPA-LEVODOPA 25-100 MG PO TABS Oral Take 1 tablet by mouth 3 (three) times daily.      Marland Kitchen CARVEDILOL 6.25 MG PO TABS Oral Take 1 tablet (6.25 mg total) by mouth 2 (two) times daily with a meal. 64 tablet 5  . DOXEPIN HCL 50 MG PO CAPS Oral Take 1 capsule (50 mg total) by mouth at bedtime. 30 capsule 11  . DOXYCYCLINE HYCLATE 100 MG PO CAPS Oral Take 100 mg by mouth 2 (two) times daily.    Marland Kitchen HYDROCHLOROTHIAZIDE 25 MG PO TABS Oral Take 1 tablet (25 mg total) by mouth daily. 30 tablet 11  . HYDROCORTISONE 1 % EX OINT Topical Apply topically 3 (three) times daily. 30 g 3  . LORATADINE 10 MG PO TABS Oral Take 10 mg by mouth daily.      Marland Kitchen NAPROXEN SODIUM 220 MG PO TABS Oral Take 2 tablets (440 mg total) by mouth 2 (two) times daily with a meal. 60 tablet 11  . OLANZAPINE 10 MG PO TABS Oral  Take 5 mg by mouth 2 (two) times daily. At 8 am and at noon    . OLANZAPINE 20 MG PO TABS Oral Take 10 mg by mouth 2 (two) times daily. At 5 pm and 10 pm    . OMEPRAZOLE 20 MG PO CPDR Oral Take 1 capsule (20 mg total) by mouth daily. 30 capsule 11  . PAROXETINE HCL 40 MG PO TABS Oral Take 40 mg by mouth every morning.      Marland Kitchen PRENATAL 27-0.8 MG PO TABS Oral Take 1 tablet by mouth daily.    Marland Kitchen RIVASTIGMINE 4.6 MG/24HR TD PT24 Transdermal Place 1 patch onto the skin daily.      . SELENIUM SULFIDE 1 % EX LOTN Topical Apply 1 application topically 2 (two) times a week. On Wednesday and Friday    . SODIUM FLUORIDE 1.1 % DT GEL dental Place onto teeth at bedtime.      . TRAVOPROST 0.004 % OP SOLN Both Eyes Place 1 drop into both eyes daily.     . TRAZODONE HCL 100 MG PO TABS Oral Take 100 mg by mouth at bedtime.    Marland Kitchen VALSARTAN 160 MG PO TABS Oral Take 160 mg by mouth 2 (two) times daily.        BP 116/62  Pulse 79  Temp 98.4 F (36.9 C) (Oral)  Resp 18  SpO2 99%  Physical Exam  Nursing note and vitals reviewed. Constitutional: She is oriented to person, place, and time. She appears well-developed and well-nourished.  HENT:  Head: Normocephalic and atraumatic.  Right Ear: External ear normal.  Left Ear: External ear normal.  Nose: Nose normal.  Mouth/Throat: Oropharynx is clear and moist.       Swollen bruised right cheek   Eyes: Conjunctivae and EOM are normal. Pupils are equal, round, and reactive to light.  Neck: Normal range of motion. Neck supple.  Cardiovascular: Normal rate.   Pulmonary/Chest: Effort normal.  Abdominal: Soft.  Musculoskeletal: Normal range of motion.  Neurological: She is alert and oriented to person, place, and time. She has normal reflexes.  Skin:       Bruised right cheek and  abrasion right upper lip,  Psychiatric: She has a normal mood and affect.    ED Course  Procedures (including critical care time)  Labs Reviewed - No data to display No results  found.   1. Contusion of face     Results for orders placed during the hospital encounter of 12/16/11  URINALYSIS, ROUTINE W REFLEX MICROSCOPIC      Component Value Range   Color, Urine YELLOW  YELLOW   APPearance CLEAR  CLEAR   Specific Gravity, Urine 1.013  1.005 - 1.030   pH 7.0  5.0 - 8.0   Glucose, UA NEGATIVE  NEGATIVE mg/dL   Hgb urine dipstick NEGATIVE  NEGATIVE   Bilirubin Urine NEGATIVE  NEGATIVE   Ketones, ur NEGATIVE  NEGATIVE mg/dL   Protein, ur NEGATIVE  NEGATIVE mg/dL   Urobilinogen, UA 0.2  0.0 - 1.0 mg/dL   Nitrite NEGATIVE  NEGATIVE   Leukocytes, UA NEGATIVE  NEGATIVE   Ct Head Wo Contrast  01/03/2012  *RADIOLOGY REPORT*  Clinical Data:  Status post fall, with right-sided facial swelling and bruising, and swelling and bruising at the chin.  Headache.  CT HEAD WITHOUT CONTRAST CT MAXILLOFACIAL WITHOUT CONTRAST  Technique:  Multidetector CT imaging of the head and maxillofacial structures were performed using the standard protocol without intravenous contrast. Multiplanar CT image reconstructions of the maxillofacial structures were also generated.  Comparison:  CT of the head performed 08/22/2011, and MRI of the brain performed 08/07/2009  CT HEAD  Findings: There is no evidence of acute infarction, mass lesion, or intra- or extra-axial hemorrhage on CT.  Prominence of the ventricles and sulci reflects mild cortical volume loss.  Mild cerebellar atrophy is noted.  Mild periventricular white matter change likely reflects small vessel ischemic microangiopathy.  The brainstem and fourth ventricle are within normal limits.  The basal ganglia are unremarkable in appearance.  The cerebral hemispheres demonstrate grossly normal gray-white differentiation. No mass effect or midline shift is seen.   There is no evidence of fracture; visualized osseous structures are unremarkable in appearance.  The visualized portions of the orbits are within normal limits.  The paranasal sinuses and  mastoid air cells are well-aerated.  No significant soft tissue abnormalities are seen.  IMPRESSION:  1.  No evidence of traumatic intracranial injury or fracture. 2.  Mild cortical volume loss and mild small vessel ischemic microangiopathy.  CT MAXILLOFACIAL  Findings:   There is no evidence of fracture or dislocation.  The maxilla and mandible appear intact.  The nasal bone is unremarkable in appearance.  The visualized dentition demonstrates no acute abnormality.  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  Mild degenerative change is noted along the upper cervical spine, with small anterior and posterior disc complexes at multiple levels.  The orbits are intact bilaterally.  The visualized paranasal sinuses and mastoid air cells are well-aerated.  Mild soft tissue swelling is noted overlying the right maxilla. The parapharyngeal fat planes are preserved.  The nasopharynx, oropharynx and hypopharynx are unremarkable in appearance.  The visualized portions of the valleculae and piriform sinuses are grossly unremarkable.  The parotid and submandibular glands are within normal limits.  No cervical lymphadenopathy is seen.  IMPRESSION:  1.  No evidence of fracture or dislocation. 2.  Mild soft tissue swelling noted overlying the right maxilla. 3.  Mild degenerative change noted along the upper cervical spine.  Original Report Authenticated By: Tonia Ghent, M.D.   Ct Maxillofacial Wo Cm  01/03/2012  *RADIOLOGY REPORT*  Clinical Data:  Status post fall, with right-sided facial swelling and bruising, and swelling and bruising at the chin.  Headache.  CT HEAD WITHOUT CONTRAST CT MAXILLOFACIAL WITHOUT CONTRAST  Technique:  Multidetector CT imaging of the head and maxillofacial structures were performed using the standard protocol without intravenous contrast. Multiplanar CT image reconstructions of the maxillofacial structures were also generated.  Comparison:  CT of the head performed  08/22/2011, and MRI of the brain performed 08/07/2009  CT HEAD  Findings: There is no evidence of acute infarction, mass lesion, or intra- or extra-axial hemorrhage on CT.  Prominence of the ventricles and sulci reflects mild cortical volume loss.  Mild cerebellar atrophy is noted.  Mild periventricular white matter change likely reflects small vessel ischemic microangiopathy.  The brainstem and fourth ventricle are within normal limits.  The basal ganglia are unremarkable in appearance.  The cerebral hemispheres demonstrate grossly normal gray-white differentiation. No mass effect or midline shift is seen.   There is no evidence of fracture; visualized osseous structures are unremarkable in appearance.  The visualized portions of the orbits are within normal limits.  The paranasal sinuses and mastoid air cells are well-aerated.  No significant soft tissue abnormalities are seen.  IMPRESSION:  1.  No evidence of traumatic intracranial injury or fracture. 2.  Mild cortical volume loss and mild small vessel ischemic microangiopathy.  CT MAXILLOFACIAL  Findings:   There is no evidence of fracture or dislocation.  The maxilla and mandible appear intact.  The nasal bone is unremarkable in appearance.  The visualized dentition demonstrates no acute abnormality.  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  Mild degenerative change is noted along the upper cervical spine, with small anterior and posterior disc complexes at multiple levels.  The orbits are intact bilaterally.  The visualized paranasal sinuses and mastoid air cells are well-aerated.  Mild soft tissue swelling is noted overlying the right maxilla. The parapharyngeal fat planes are preserved.  The nasopharynx, oropharynx and hypopharynx are unremarkable in appearance.  The visualized portions of the valleculae and piriform sinuses are grossly unremarkable.  The parotid and submandibular glands are within normal limits.  No cervical  lymphadenopathy is seen.  IMPRESSION:  1.  No evidence of fracture or dislocation. 2.  Mild soft tissue swelling noted overlying the right maxilla. 3.  Mild degenerative change noted along the upper cervical spine.  Original Report Authenticated By: Tonia Ghent, M.D.     MDM    xrays show no evidence of fracture.  Pt discharged to return to memory care at [facility].  Tylenol for pain.  Return if any problems.      Lonia Skinner [place], [place] 01/04/12 1414  Lonia Skinner [place], [place] 01/06/12 1728  Lonia Skinner [place], [place] 01/06/12 1729  Lonia Skinner [place], [place] 01/06/12 1729

## 2012-01-02 NOTE — ED Notes (Signed)
Pt is from [facility] in the memory care unit. Pt's sister, POA, was taking the patient out to dinner tonight when she noticed that pt had an abrasion to her right upper lip and swelling/bruising under right eye. Pt also has redness/abrasion to right forearm. Sister states pt's injuries were not there last night.

## 2012-01-03 NOTE — ED Notes (Signed)
Pt continues to wait for CT. No change in condition. Ice pack reapplied to face. Sister at bedside. Warm blankets applied.

## 2012-01-03 NOTE — ED Notes (Signed)
Patient transported to CT 

## 2012-01-03 NOTE — ED Notes (Signed)
Patient returned from CT

## 2012-01-07 ENCOUNTER — Telehealth: Payer: Self-pay | Admitting: Family Medicine

## 2012-01-07 NOTE — Telephone Encounter (Signed)
Lorraine [name] called from [place] [phone] and needs a order for wound care ( home health nurse ). Dr. Clent Ridges did give a verbal order to wash with warm soap water and then apply neosporin once a day, also okay for staple removal per hospital discharge summary. I gave this information to [facility].

## 2012-01-09 NOTE — ED Provider Notes (Signed)
Medical screening examination/treatment/procedure(s) were conducted as a shared visit with non-physician practitioner(s) and myself.  I personally evaluated the patient during the encounter  Nursing home patient with fall will need ct of head and face, if negative can return.   Results for orders placed during the hospital encounter of 12/16/11  URINALYSIS, ROUTINE W REFLEX MICROSCOPIC      Component Value Range   Color, Urine YELLOW  YELLOW   APPearance CLEAR  CLEAR   Specific Gravity, Urine 1.013  1.005 - 1.030   pH 7.0  5.0 - 8.0   Glucose, UA NEGATIVE  NEGATIVE mg/dL   Hgb urine dipstick NEGATIVE  NEGATIVE   Bilirubin Urine NEGATIVE  NEGATIVE   Ketones, ur NEGATIVE  NEGATIVE mg/dL   Protein, ur NEGATIVE  NEGATIVE mg/dL   Urobilinogen, UA 0.2  0.0 - 1.0 mg/dL   Nitrite NEGATIVE  NEGATIVE   Leukocytes, UA NEGATIVE  NEGATIVE   Ct Head Wo Contrast  01/03/2012  *RADIOLOGY REPORT*  Clinical Data:  Status post fall, with right-sided facial swelling and bruising, and swelling and bruising at the chin.  Headache.  CT HEAD WITHOUT CONTRAST CT MAXILLOFACIAL WITHOUT CONTRAST  Technique:  Multidetector CT imaging of the head and maxillofacial structures were performed using the standard protocol without intravenous contrast. Multiplanar CT image reconstructions of the maxillofacial structures were also generated.  Comparison:  CT of the head performed 08/22/2011, and MRI of the brain performed 08/07/2009  CT HEAD  Findings: There is no evidence of acute infarction, mass lesion, or intra- or extra-axial hemorrhage on CT.  Prominence of the ventricles and sulci reflects mild cortical volume loss.  Mild cerebellar atrophy is noted.  Mild periventricular white matter change likely reflects small vessel ischemic microangiopathy.  The brainstem and fourth ventricle are within normal limits.  The basal ganglia are unremarkable in appearance.  The cerebral hemispheres demonstrate grossly normal gray-white  differentiation. No mass effect or midline shift is seen.   There is no evidence of fracture; visualized osseous structures are unremarkable in appearance.  The visualized portions of the orbits are within normal limits.  The paranasal sinuses and mastoid air cells are well-aerated.  No significant soft tissue abnormalities are seen.  IMPRESSION:  1.  No evidence of traumatic intracranial injury or fracture. 2.  Mild cortical volume loss and mild small vessel ischemic microangiopathy.  CT MAXILLOFACIAL  Findings:   There is no evidence of fracture or dislocation.  The maxilla and mandible appear intact.  The nasal bone is unremarkable in appearance.  The visualized dentition demonstrates no acute abnormality.  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  Mild degenerative change is noted along the upper cervical spine, with small anterior and posterior disc complexes at multiple levels.  The orbits are intact bilaterally.  The visualized paranasal sinuses and mastoid air cells are well-aerated.  Mild soft tissue swelling is noted overlying the right maxilla. The parapharyngeal fat planes are preserved.  The nasopharynx, oropharynx and hypopharynx are unremarkable in appearance.  The visualized portions of the valleculae and piriform sinuses are grossly unremarkable.  The parotid and submandibular glands are within normal limits.  No cervical lymphadenopathy is seen.  IMPRESSION:  1.  No evidence of fracture or dislocation. 2.  Mild soft tissue swelling noted overlying the right maxilla. 3.  Mild degenerative change noted along the upper cervical spine.  Original Report Authenticated By: Tonia Ghent, M.D.   Ct Maxillofacial Wo Cm  01/03/2012  *RADIOLOGY REPORT*  Clinical Data:  Status post fall, with right-sided facial swelling and bruising, and swelling and bruising at the chin.  Headache.  CT HEAD WITHOUT CONTRAST CT MAXILLOFACIAL WITHOUT CONTRAST  Technique:  Multidetector CT imaging  of the head and maxillofacial structures were performed using the standard protocol without intravenous contrast. Multiplanar CT image reconstructions of the maxillofacial structures were also generated.  Comparison:  CT of the head performed 08/22/2011, and MRI of the brain performed 08/07/2009  CT HEAD  Findings: There is no evidence of acute infarction, mass lesion, or intra- or extra-axial hemorrhage on CT.  Prominence of the ventricles and sulci reflects mild cortical volume loss.  Mild cerebellar atrophy is noted.  Mild periventricular white matter change likely reflects small vessel ischemic microangiopathy.  The brainstem and fourth ventricle are within normal limits.  The basal ganglia are unremarkable in appearance.  The cerebral hemispheres demonstrate grossly normal gray-white differentiation. No mass effect or midline shift is seen.   There is no evidence of fracture; visualized osseous structures are unremarkable in appearance.  The visualized portions of the orbits are within normal limits.  The paranasal sinuses and mastoid air cells are well-aerated.  No significant soft tissue abnormalities are seen.  IMPRESSION:  1.  No evidence of traumatic intracranial injury or fracture. 2.  Mild cortical volume loss and mild small vessel ischemic microangiopathy.  CT MAXILLOFACIAL  Findings:   There is no evidence of fracture or dislocation.  The maxilla and mandible appear intact.  The nasal bone is unremarkable in appearance.  The visualized dentition demonstrates no acute abnormality.  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  Mild degenerative change is noted along the upper cervical spine, with small anterior and posterior disc complexes at multiple levels.  The orbits are intact bilaterally.  The visualized paranasal sinuses and mastoid air cells are well-aerated.  Mild soft tissue swelling is noted overlying the right maxilla. The parapharyngeal fat planes are preserved.  The  nasopharynx, oropharynx and hypopharynx are unremarkable in appearance.  The visualized portions of the valleculae and piriform sinuses are grossly unremarkable.  The parotid and submandibular glands are within normal limits.  No cervical lymphadenopathy is seen.  IMPRESSION:  1.  No evidence of fracture or dislocation. 2.  Mild soft tissue swelling noted overlying the right maxilla. 3.  Mild degenerative change noted along the upper cervical spine.  Original Report Authenticated By: Tonia Ghent, M.D.      Shelda Jakes, MD 01/09/12 2213

## 2012-01-10 ENCOUNTER — Telehealth: Payer: Self-pay | Admitting: Family Medicine

## 2012-01-10 MED ORDER — PAROXETINE HCL 40 MG PO TABS: 40.0000 mg | ORAL_TABLET | ORAL | Status: DC

## 2012-01-10 NOTE — Telephone Encounter (Signed)
Script was printed and faxed. 

## 2012-01-10 NOTE — Telephone Encounter (Signed)
Refill request for Paroxetine 40 mg take 1 po qd and send to [facility].

## 2012-01-10 NOTE — Telephone Encounter (Signed)
Refill for a year 

## 2012-01-13 ENCOUNTER — Telehealth: Payer: Self-pay | Admitting: Family Medicine

## 2012-01-13 NOTE — Telephone Encounter (Signed)
I wouldn't do anything at this point. Let her rest and follow the BP readings. If she stays in the 160s after several days, let me know

## 2012-01-13 NOTE — Telephone Encounter (Signed)
I spoke with Dannielle Huh and gave the below message.

## 2012-01-13 NOTE — Telephone Encounter (Signed)
[name] from home health called to give a blood pressure reading of 162/84 while sitting today. Please advise?

## 2012-01-15 ENCOUNTER — Telehealth: Payer: Self-pay | Admitting: Family Medicine

## 2012-01-15 NOTE — Telephone Encounter (Signed)
Send in a year supply

## 2012-01-15 NOTE — Telephone Encounter (Signed)
Refill request for Zyprexa 10 mg take 1/2 tablet at 8 am & at noon and send to [facility] phone # [phone] and fax [phone].

## 2012-01-16 MED ORDER — OLANZAPINE 10 MG PO TABS: 5.0000 mg | ORAL_TABLET | Freq: Two times a day (BID) | ORAL | Status: DC

## 2012-01-16 NOTE — Telephone Encounter (Signed)
I printed the script and faxed to the below number.

## 2012-01-17 ENCOUNTER — Telehealth: Payer: Self-pay | Admitting: Family Medicine

## 2012-01-17 NOTE — Telephone Encounter (Signed)
First of all, I have repeatedly instructed them that NO ONE is to change her psychotropic meds except me. That is why she is messed up now. Before I can answer this question, I need to know exactly what meds she is currently taking

## 2012-01-17 NOTE — Telephone Encounter (Signed)
Dr. Clent Ridges, I called the in home nurse and got a list of her psych meds. Per Meriam Sprague, she states that the community where Jemila lives ([facility] in [place]) is now using a new inpatient psych group based out of [place]. The nurse who came there today is York Spaniel (the number for her answering service is [phone]). As stated previously, the hospital took Miniya off all of the psych meds. The psych nurse has put her back on some, and added one. Here is the current med list (psych only):  KLONOPIN    0.5mg  at 0800, 1mg  at 1300, 1mg  @ HS TRAZODONE 100mg  at HS SINEQUAN   50mg  at HS ZYPREXA 5mg  at 0800 & 1200, 10mg  at 1700 & 2200  The in-home nurse, Meriam Sprague, asked that you call her back on Monday after you've decided where you'd like to go from here. Thank you.

## 2012-01-17 NOTE — Telephone Encounter (Signed)
Nurse states she spoke with you earlier in the wk about elevated BP in this patient. You stated you wanted the nurse to get back with you at then end of the week about this. Nurse calling now and needs a call back from you today if possible about some issues: her BP is still elevated, but not a HTN elevation. It is due to heightened anxiety. When in the hospital, they pulled her off of all her psychotropic meds. The in-home psych nurse is following and did start some pscyh meds. Home nurse feels that this morning she is still super anxious and has constant distress. Cannot sit still, constantly hums, cannot relax or focus her thought process.  Nurse requesting call to: discuss meds, and get an order for in-home speech therapy to eval & treat.

## 2012-01-21 ENCOUNTER — Telehealth: Payer: Self-pay | Admitting: Family Medicine

## 2012-01-21 NOTE — Telephone Encounter (Signed)
RN from [facility], requesting order for OT due to fall. OT to work on ADLs. Please call with order.

## 2012-01-21 NOTE — Telephone Encounter (Signed)
I left voice message for Lorraine [name] with below information.

## 2012-01-21 NOTE — Telephone Encounter (Signed)
I left voice message for Lorraine [name] with the below information.

## 2012-01-21 NOTE — Telephone Encounter (Signed)
I would keep her on the current regimen for now. I do not like the idea of 2 "cooks in the kitchen". If the psych nurse is going to manage these meds, then keep me out of it

## 2012-01-21 NOTE — Telephone Encounter (Signed)
Please order OT as requested

## 2012-01-22 DIAGNOSIS — I509 Heart failure, unspecified: Secondary | ICD-10-CM

## 2012-01-22 DIAGNOSIS — G20 Parkinson's disease: Secondary | ICD-10-CM

## 2012-01-22 DIAGNOSIS — Z9181 History of falling: Secondary | ICD-10-CM

## 2012-01-22 DIAGNOSIS — S0100XA Unspecified open wound of scalp, initial encounter: Secondary | ICD-10-CM

## 2012-01-24 ENCOUNTER — Encounter: Payer: Self-pay | Admitting: Family Medicine

## 2012-01-24 ENCOUNTER — Ambulatory Visit (INDEPENDENT_AMBULATORY_CARE_PROVIDER_SITE_OTHER): Payer: Medicare PPO | Admitting: Family Medicine

## 2012-01-24 ENCOUNTER — Telehealth: Payer: Self-pay | Admitting: Family Medicine

## 2012-01-24 VITALS — BP 164/88 | HR 94 | Temp 98.6°F

## 2012-01-24 DIAGNOSIS — R350 Frequency of micturition: Secondary | ICD-10-CM

## 2012-01-24 DIAGNOSIS — N390 Urinary tract infection, site not specified: Secondary | ICD-10-CM

## 2012-01-24 DIAGNOSIS — F068 Other specified mental disorders due to known physiological condition: Secondary | ICD-10-CM

## 2012-01-24 DIAGNOSIS — G20 Parkinson's disease: Secondary | ICD-10-CM

## 2012-01-24 LAB — POCT URINALYSIS DIPSTICK
Bilirubin, UA: NEGATIVE
Blood, UA: NEGATIVE
Glucose, UA: NEGATIVE
pH, UA: 7

## 2012-01-24 NOTE — Telephone Encounter (Signed)
Tell them she is here now to be seen

## 2012-01-24 NOTE — Progress Notes (Signed)
  Subjective:    Patient ID: Lorraine [name], female    DOB: [birth year], 66 y.o.   MRN: [id number]  HPI Here with her sister, Manson Allan, for 2 days of urinary urgency and burning. No nausea or fever. Also during the past week she has had some mental status changes with more confusion and more agitation.    Review of Systems  Constitutional: Negative.   Respiratory: Negative.   Cardiovascular: Negative.   Genitourinary: Positive for dysuria, urgency and frequency. Negative for hematuria, flank pain and pelvic pain.  Psychiatric/Behavioral: Positive for behavioral problems, confusion and agitation.       Objective:   Physical Exam  Constitutional: She appears well-developed and well-nourished.       Walks slowly with a walker   Cardiovascular: Normal rate, regular rhythm, normal heart sounds and intact distal pulses.   Pulmonary/Chest: Effort normal and breath sounds normal.  Abdominal: Soft. Bowel sounds are normal. She exhibits no distension and no mass. There is no tenderness. There is no rebound and no guarding.  Psychiatric:       Somewhat lethargic, not oriented except for self, confused. She can answer a question with a single sentence but then starts to say nonsensical things after that           Assessment & Plan:  Treat the UTI with Septra DS and culture the urine. Encourage her to drink fluids. The UTI has probably caused an element of delirium which has upset her mental status. We will monitor this closely.

## 2012-01-24 NOTE — Telephone Encounter (Signed)
Pt showing sign on incontinence. Urgency, Frequency. Req an order to get a clean catch ua asap. Pls call.

## 2012-01-26 LAB — URINE CULTURE

## 2012-01-27 NOTE — Progress Notes (Signed)
Quick Note:  I left voice message with results. ______ 

## 2012-01-28 ENCOUNTER — Telehealth: Payer: Self-pay | Admitting: Family Medicine

## 2012-01-28 MED ORDER — VALSARTAN 160 MG PO TABS: 160.0000 mg | ORAL_TABLET | Freq: Two times a day (BID) | ORAL | Status: DC

## 2012-01-28 NOTE — Telephone Encounter (Signed)
I sent script e-scribe for Diovan.

## 2012-02-17 ENCOUNTER — Telehealth: Payer: Self-pay | Admitting: Family Medicine

## 2012-02-17 MED ORDER — CARVEDILOL 6.25 MG PO TABS: 6.2500 mg | ORAL_TABLET | Freq: Two times a day (BID) | ORAL | Status: AC

## 2012-02-17 NOTE — Telephone Encounter (Signed)
Refill request for Carvedilol 6.25 mg and Dr. Clent Ridges did approve. I sent script e-scribe to [facility].

## 2012-03-04 ENCOUNTER — Telehealth: Payer: Self-pay | Admitting: Family Medicine

## 2012-03-04 NOTE — Telephone Encounter (Signed)
[facility] called and are needing to get a verbal order for continued speech therapy.

## 2012-03-04 NOTE — Telephone Encounter (Signed)
A voice message was left from Norberto Sorenson [phone] ( physical therapy ) requesting a order to continue with PT to help with gait & balance, for 3 more weeks at twice a week.

## 2012-03-05 NOTE — Telephone Encounter (Signed)
Okay to both of these requests

## 2012-03-06 ENCOUNTER — Telehealth: Payer: Self-pay | Admitting: Family Medicine

## 2012-03-06 MED ORDER — CLONAZEPAM 1 MG PO TABS: 1.0000 mg | ORAL_TABLET | Freq: Every evening | ORAL | Status: DC | PRN

## 2012-03-06 MED ORDER — CLONAZEPAM 0.5 MG PO TABS: 0.5000 mg | ORAL_TABLET | Freq: Two times a day (BID) | ORAL | Status: DC | PRN

## 2012-03-06 NOTE — Telephone Encounter (Signed)
I left voice message with below information. 

## 2012-03-06 NOTE — Telephone Encounter (Signed)
Call in Clonazepam 0.5 mg to take one in the am and one in the afternoon, #60 with 5 rf. Also call in Clonazepam 1 mg to take qhs, #30 with 5 rf

## 2012-03-06 NOTE — Telephone Encounter (Signed)
I printed both scripts and faxed to [facility].

## 2012-03-06 NOTE — Telephone Encounter (Signed)
Refill request for Clonazepam 1 mg ( [facility] )

## 2012-03-10 ENCOUNTER — Encounter: Payer: Self-pay | Admitting: Family Medicine

## 2012-03-10 ENCOUNTER — Ambulatory Visit (INDEPENDENT_AMBULATORY_CARE_PROVIDER_SITE_OTHER): Payer: Medicare PPO | Admitting: Family Medicine

## 2012-03-10 VITALS — BP 140/86 | HR 105 | Temp 98.6°F | Wt 158.0 lb

## 2012-03-10 DIAGNOSIS — L0293 Carbuncle, unspecified: Secondary | ICD-10-CM

## 2012-03-10 MED ORDER — DOXYCYCLINE HYCLATE 100 MG PO CAPS: 100.0000 mg | ORAL_CAPSULE | Freq: Two times a day (BID) | ORAL | Status: DC

## 2012-03-10 NOTE — Progress Notes (Signed)
  Subjective:    Patient ID: Lorraine [name], female    DOB: 1946/04/20, 66 y.o.   MRN: [id number]  HPI Here with her sister for multiple boils over the trunk. These started about 2 weeks ago and they have rapidly spread. They are somewhat painful. No fevers. She was treated for culture proven MRSA this past spring.    Review of Systems  Constitutional: Negative.   Skin: Positive for rash.       Objective:   Physical Exam  Constitutional: She appears well-developed and well-nourished.  Skin:       Numerous pustules or papules over the entire trunk          Assessment & Plan:  Multiple boils, probably a recurrence of MRSA. Cover with Doxycycline for 30 days.

## 2012-03-11 ENCOUNTER — Telehealth: Payer: Self-pay | Admitting: Family Medicine

## 2012-03-11 ENCOUNTER — Encounter (HOSPITAL_COMMUNITY): Payer: Self-pay | Admitting: Family Medicine

## 2012-03-11 ENCOUNTER — Emergency Department (HOSPITAL_COMMUNITY)
Admission: EM | Admit: 2012-03-11 | Discharge: 2012-03-11 | Disposition: A | Discharge status: Home / Self Care | Payer: Medicare PPO | Attending: Emergency Medicine | Admitting: Emergency Medicine

## 2012-03-11 DIAGNOSIS — K219 Gastro-esophageal reflux disease without esophagitis: Secondary | ICD-10-CM | POA: Insufficient documentation

## 2012-03-11 DIAGNOSIS — L02219 Cutaneous abscess of trunk, unspecified: Secondary | ICD-10-CM | POA: Insufficient documentation

## 2012-03-11 DIAGNOSIS — F329 Major depressive disorder, single episode, unspecified: Secondary | ICD-10-CM | POA: Insufficient documentation

## 2012-03-11 DIAGNOSIS — I10 Essential (primary) hypertension: Secondary | ICD-10-CM | POA: Insufficient documentation

## 2012-03-11 DIAGNOSIS — Z803 Family history of malignant neoplasm of breast: Secondary | ICD-10-CM | POA: Insufficient documentation

## 2012-03-11 DIAGNOSIS — Z888 Allergy status to other drugs, medicaments and biological substances status: Secondary | ICD-10-CM | POA: Insufficient documentation

## 2012-03-11 DIAGNOSIS — L0293 Carbuncle, unspecified: Secondary | ICD-10-CM

## 2012-03-11 DIAGNOSIS — Z8261 Family history of arthritis: Secondary | ICD-10-CM | POA: Insufficient documentation

## 2012-03-11 DIAGNOSIS — Z8489 Family history of other specified conditions: Secondary | ICD-10-CM | POA: Insufficient documentation

## 2012-03-11 DIAGNOSIS — J45909 Unspecified asthma, uncomplicated: Secondary | ICD-10-CM | POA: Insufficient documentation

## 2012-03-11 DIAGNOSIS — Z7982 Long term (current) use of aspirin: Secondary | ICD-10-CM | POA: Insufficient documentation

## 2012-03-11 DIAGNOSIS — Z823 Family history of stroke: Secondary | ICD-10-CM | POA: Insufficient documentation

## 2012-03-11 DIAGNOSIS — F3289 Other specified depressive episodes: Secondary | ICD-10-CM | POA: Insufficient documentation

## 2012-03-11 DIAGNOSIS — Z881 Allergy status to other antibiotic agents status: Secondary | ICD-10-CM | POA: Insufficient documentation

## 2012-03-11 DIAGNOSIS — Z833 Family history of diabetes mellitus: Secondary | ICD-10-CM | POA: Insufficient documentation

## 2012-03-11 IMAGING — CT CT HEAD W/O CM
1 series · 16 of 30 positions shown, 20 images · non-contrast
Comparison: 08/06/2009

CLINICAL DATA: Fall, laceration to left side of head

CT HEAD WITHOUT CONTRAST
TECHNIQUE: Contiguous axial images were obtained from the base of
the skull through the vertex without contrast.

[Series 2: head 4.8 h37s · axial · 0.44mm/px · z∈[-258,-118]mm · 16 of 32 slices shown, 20 images]
[im 2/32  brain]
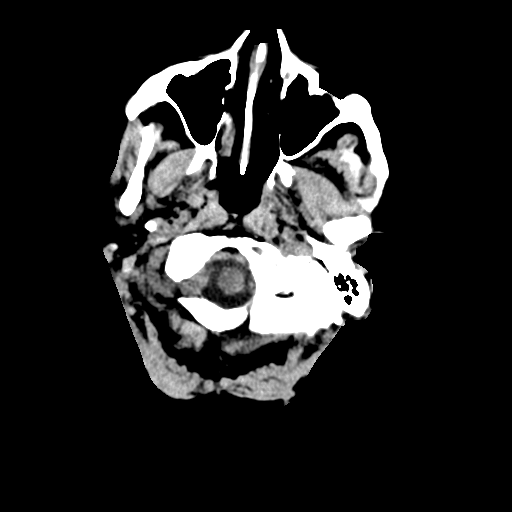
[im 2/32  bone]
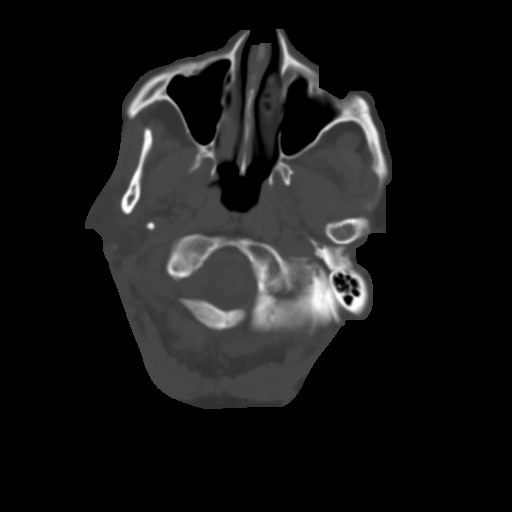
[im 4/32  brain]
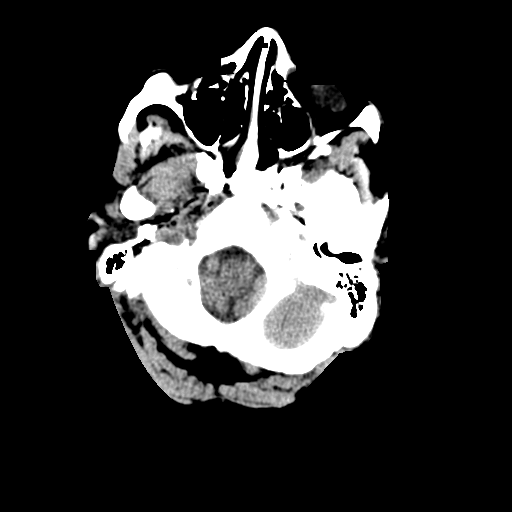
[im 6/32  brain]
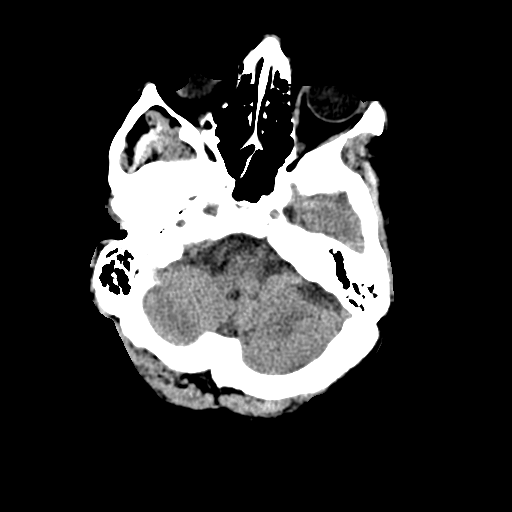
[im 8/32  brain]
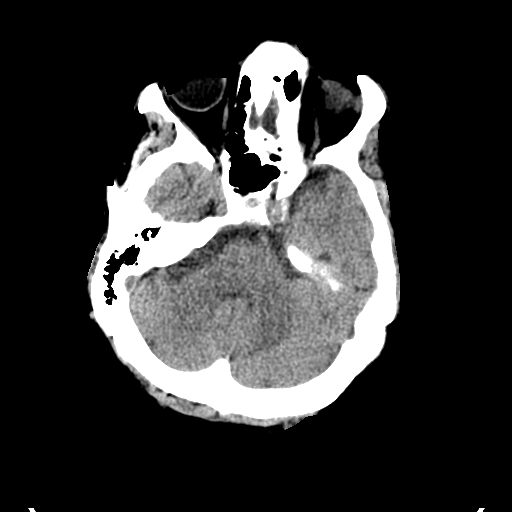
[im 9/32  brain]
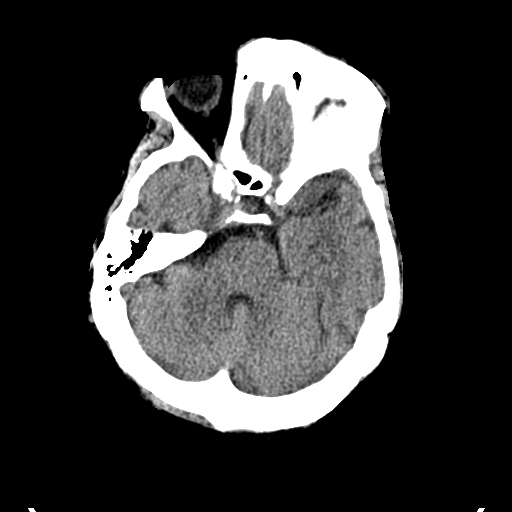
[im 9/32  bone]
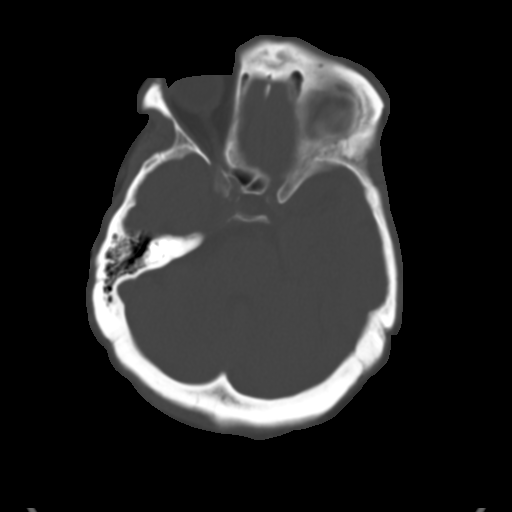
[im 11/32  brain]
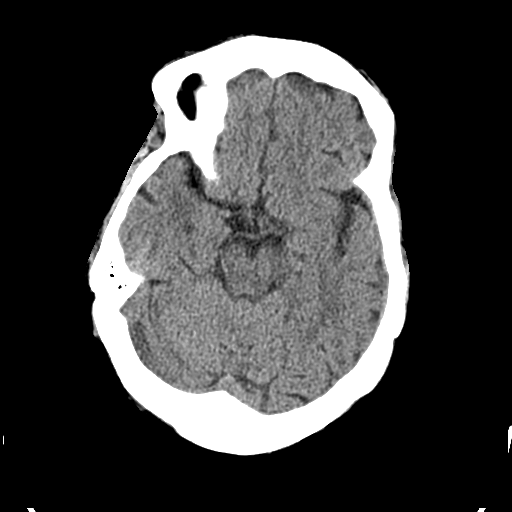
[im 13/32  brain]
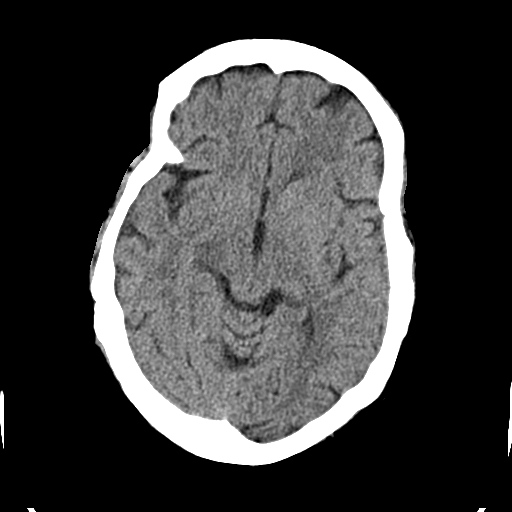
[im 15/32  brain]
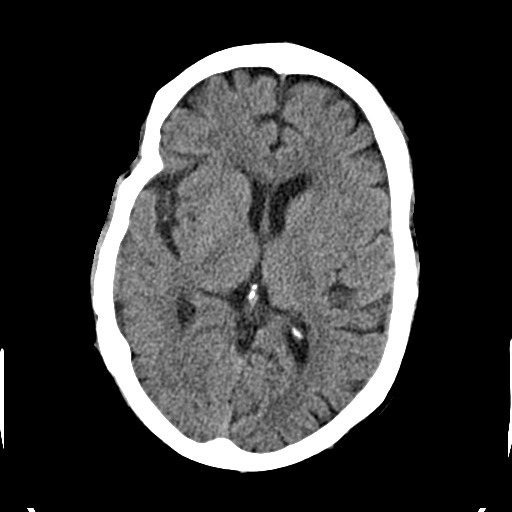
[im 17/32  brain]
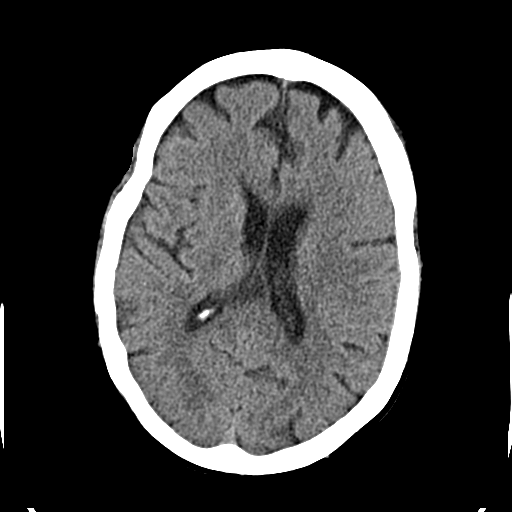
[im 17/32  bone]
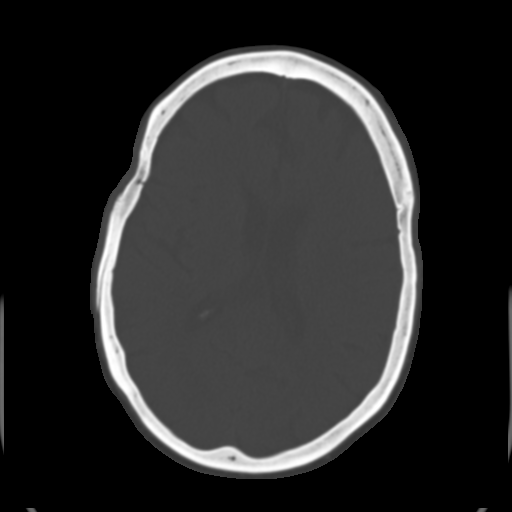
[im 19/32  brain]
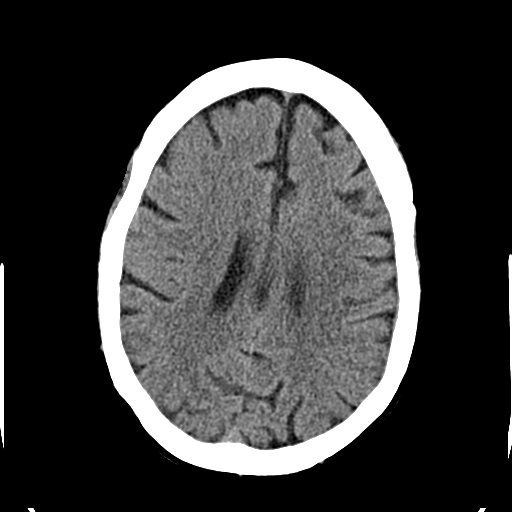
[im 21/32  brain]
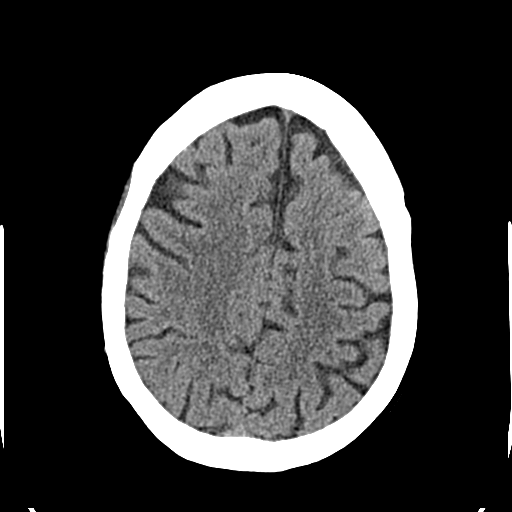
[im 23/32  brain]
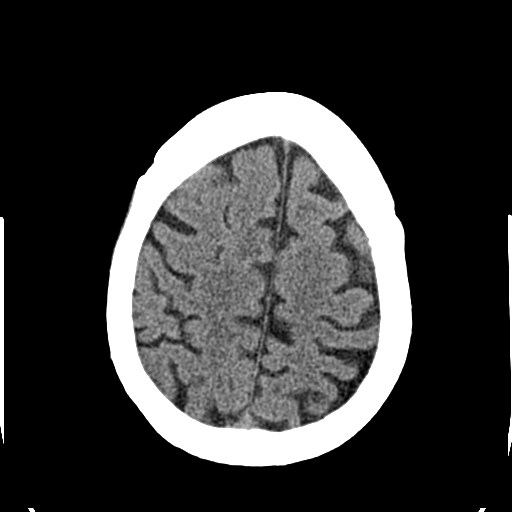
[im 24/32  brain]
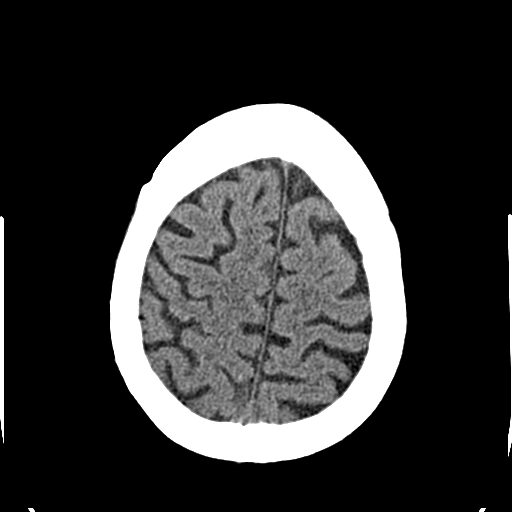
[im 24/32  bone]
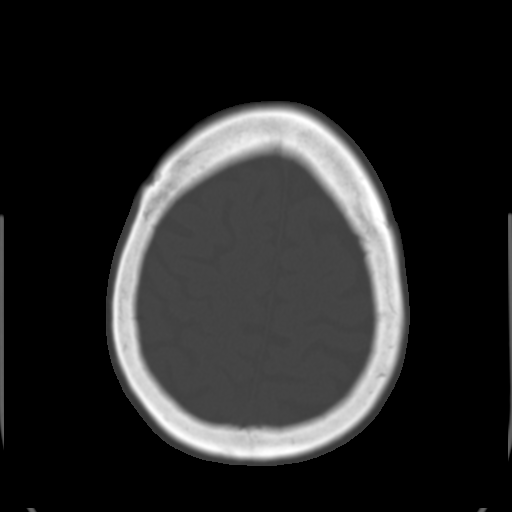
[im 26/32  brain]
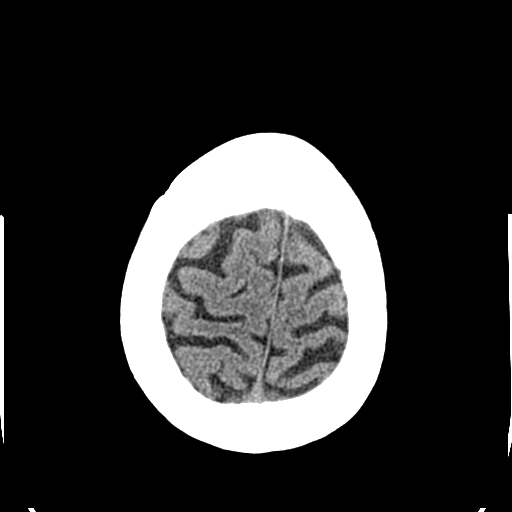
[im 28/32  brain]
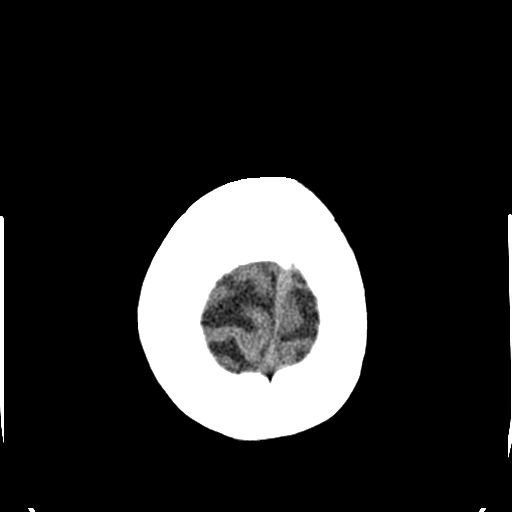
[im 30/32  brain]
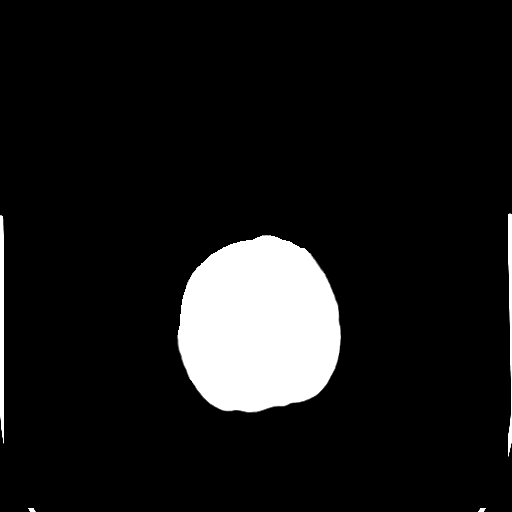

[16 of 30 positions shown; findings below may reference images not displayed]

FINDINGS: Mild atrophy.
Normal ventricular morphology.
No midline shift or mass effect.
Otherwise normal appearance of brain parenchyma.
No intracranial hemorrhage, mass lesion, or evidence of acute
infarction.
Visualized paranasal sinuses and mastoid air cells clear.
Bones unremarkable.
IMPRESSION: No acute intracranial abnormalities.

## 2012-03-11 MED ORDER — NAPROXEN SODIUM 220 MG PO TABS: 440.0000 mg | ORAL_TABLET | Freq: Two times a day (BID) | ORAL | Status: AC

## 2012-03-11 MED ORDER — LORATADINE 10 MG PO TABS: 10.0000 mg | ORAL_TABLET | Freq: Every day | ORAL | Status: DC

## 2012-03-11 NOTE — Telephone Encounter (Signed)
I left voice message with below information. 

## 2012-03-11 NOTE — Telephone Encounter (Signed)
Lorraine [name] with [facility] [phone] calling back regarding a message she left earlier.  She is getting ready to leave but wanted to find out before she left if the patient needs to be on isolation.  Please call her back as soon as possible.  Thanks

## 2012-03-11 NOTE — Telephone Encounter (Signed)
No I don't think she needs to be on isolation as long as she has regular clothing on.

## 2012-03-11 NOTE — ED Provider Notes (Signed)
History    This chart was scribed for Lorraine Gaskins, MD, MD by Smitty Pluck. The patient was seen in room TR04C and the patient's care was started at 2:39PM.   CSN: [id number]  Arrival date & time 03/11/12  1420   None     Chief Complaint  Patient presents with  . Abscess     Patient is a 66 y.o. female presenting with abscess. The history is provided by the patient. No language interpreter was used.  Abscess  Pertinent negatives include no fever and no vomiting.   Lorraine [name] is a 66 y.o. female who presents to the Emergency Department with hx of dementia from [facility] complaining of multiple boils on back and abdomen causing moderate pain in areas where boils are present. Pt was seen at PCP 1 day ago and given doxycycline. Symptoms have worsened since seeing PCP and nursing home wanted pt to come to ED. The boils hurt to touch. She reports that she has had them in the past. Denies any other pain. No fever/vomiting and no changes in mental status  PCP is Dr. Clent Ridges Past Medical History  Diagnosis Date  . CHF (congestive heart failure)   . NICM (nonischemic cardiomyopathy)     sees Dr. Gala Romney   . Mitral regurgitation     moderate - severe  . HTN (hypertension)   . HLD (hyperlipidemia)   . GERD (gastroesophageal reflux disease)   . Knee pain   . Anemia   . Dementia   . Glaucoma   . Osteoarthritis   . Hypothyroidism   . Colonic polyp   . Parkinson's disease     Sees [name] at [facility]  . Depression     sees [name]  . Asthma     followed by [name]  . Nephrolithiasis   . Glaucoma     Past Surgical History  Procedure Date  . Cholecystectomy   . Tonsillectomy   . Cardiac catheterization 04/2007    normal coronaries  . Total knee arthroplasty     right. Synvisc injections to the knees    Family History  Problem Relation Age of Onset  . Arthritis    . Breast cancer    . Diabetes    . Hyperlipidemia    . Stroke    . Cancer  Other     breast    History  Substance Use Topics  . Smoking status: Never Smoker   . Smokeless tobacco: Never Used  . Alcohol Use: No    OB History    Grav Para Term Preterm Abortions TAB SAB Ect Mult Living                  Review of Systems  Constitutional: Negative for fever and chills.  Gastrointestinal: Negative for nausea and vomiting.    Allergies  Donepezil hydrochloride; Erythromycin; and Tramadol  Home Medications   Current Outpatient Rx  Name Route Sig Dispense Refill  . ALPRAZOLAM 0.5 MG PO TABS  Take 1 tablet at 8am and take 1 tablet at noon 180 tablet 1  . ALPRAZOLAM 1 MG PO TABS  Take 1 tablet at 5pm 90 tablet 1  . ALPRAZOLAM 2 MG PO TABS Oral Take 1 tablet (2 mg total) by mouth at bedtime. 90 tablet 1  . ASPIRIN 81 MG PO CHEW Oral Chew 81 mg by mouth daily.    Marland Kitchen CALCIUM CARBONATE 600 MG PO TABS Oral Take 1 tablet (600  mg total) by mouth 2 (two) times daily with a meal. 60 tablet 11  . CARBAMAZEPINE ER 200 MG PO TB12 Oral Take 200 mg by mouth 3 (three) times daily.    Marland Kitchen CARBIDOPA-LEVODOPA 25-100 MG PO TABS Oral Take 1 tablet by mouth 3 (three) times daily.      Marland Kitchen CARVEDILOL 6.25 MG PO TABS Oral Take 1 tablet (6.25 mg total) by mouth 2 (two) times daily with a meal. 60 tablet 11  . CLONAZEPAM 0.5 MG PO TABS Oral Take 1 tablet (0.5 mg total) by mouth 2 (two) times daily as needed. 60 tablet 5  . CLONAZEPAM 1 MG PO TABS Oral Take 1 tablet (1 mg total) by mouth at bedtime as needed. 30 tablet 5  . DOXEPIN HCL 50 MG PO CAPS Oral Take 1 capsule (50 mg total) by mouth at bedtime. 30 capsule 11  . DOXYCYCLINE HYCLATE 100 MG PO CAPS Oral Take 1 capsule (100 mg total) by mouth 2 (two) times daily. 60 capsule 2  . HYDROCORTISONE 1 % EX OINT Topical Apply topically 3 (three) times daily. 30 g 3  . LORATADINE 10 MG PO TABS Oral Take 1 tablet (10 mg total) by mouth daily. 30 tablet 11  . NAPROXEN SODIUM 220 MG PO TABS Oral Take 2 tablets (440 mg total) by mouth 2 (two)  times daily with a meal. 120 tablet 6  . OLANZAPINE 10 MG PO TABS Oral Take 5 mg by mouth every morning.    Marland Kitchen OLANZAPINE 20 MG PO TABS Oral Take 10 mg by mouth.     . OMEPRAZOLE 20 MG PO CPDR Oral Take 1 capsule (20 mg total) by mouth daily. 30 capsule 11  . PAROXETINE HCL 40 MG PO TABS Oral Take 1 tablet (40 mg total) by mouth every morning. 30 tablet 11  . PRENATAL 27-0.8 MG PO TABS Oral Take 1 tablet by mouth daily.    Marland Kitchen RIVASTIGMINE 4.6 MG/24HR TD PT24 Transdermal Place 1 patch onto the skin daily.      . SELENIUM SULFIDE 1 % EX LOTN Topical Apply 1 application topically 2 (two) times a week. On Wednesday and Friday    . SODIUM FLUORIDE 1.1 % DT GEL dental Place onto teeth at bedtime.      . TRAVOPROST 0.004 % OP SOLN Both Eyes Place 1 drop into both eyes daily.     . TRAZODONE HCL 100 MG PO TABS Oral Take 100 mg by mouth at bedtime.    Marland Kitchen VALSARTAN 160 MG PO TABS Oral Take 1 tablet (160 mg total) by mouth 2 (two) times daily. 60 tablet 11    BP 122/66  Pulse 94  Temp 98.2 F (36.8 C) (Oral)  Resp 20  SpO2 98%  Physical Exam  Nursing note and vitals reviewed. CONSTITUTIONAL: Well developed/well nourished HEAD AND FACE: Normocephalic/atraumatic EYES: EOMI/PERRL ENMT: Mucous membranes moist NECK: supple no meningeal signs SPINE:entire spine nontender CV: S1/S2 noted, no murmurs/rubs/gallops noted LUNGS: Lungs are clear to auscultation bilaterally, no apparent distress ABDOMEN: soft, nontender, no rebound or guarding NEURO: Pt is awake/alert, moves all extremitiesx4 EXTREMITIES: pulses normal, full ROM SKIN: warm, color normal, multiple pustules throughout back and abdomen, early abscess on left lateral ribs but no crepitance/streaking noted PSYCH: anxious   ED Course  Procedures  DIAGNOSTIC STUDIES: Oxygen Saturation is 98% on room air, normal by my interpretation.    COORDINATION OF CARE: 2:42 PM Discussed ED treatment with pt  Pt hesitant to drain small  abscess on left  axillary line.  No crepitance.  No streaking or signs of cellulitis.  She just started doxycycline feel she can continue this and f/u with her PCP Patient/family agreeable     MDM  Nursing notes including past medical history and social history reviewed and considered in documentation Previous records reviewed and considered - recent office visit and phone calls noted    I personally performed the services described in this documentation, which was scribed in my presence. The recorded information has been reviewed and considered.         Lorraine Gaskins, MD 03/11/12 1504

## 2012-03-11 NOTE — Telephone Encounter (Signed)
Refill request for Loratadine & Naproxen, Dr. Clent Ridges did approve and I sent e-scribe to [facility].

## 2012-03-11 NOTE — Telephone Encounter (Signed)
See my other answer  

## 2012-03-11 NOTE — ED Notes (Signed)
Pt has multiple boils all over back and stomach area. Was seen by PCP yesterday and given abx. Pt has dementia and lives at [facility]

## 2012-03-11 NOTE — Telephone Encounter (Signed)
Joyce Gross with [facility] calling to see if the patient is on Isolation.  States patient was seen in the office for MRSA infection.  Please call her back.  Thanks

## 2012-03-16 DIAGNOSIS — I509 Heart failure, unspecified: Secondary | ICD-10-CM

## 2012-03-16 DIAGNOSIS — A4902 Methicillin resistant Staphylococcus aureus infection, unspecified site: Secondary | ICD-10-CM

## 2012-03-16 DIAGNOSIS — L02229 Furuncle of trunk, unspecified: Secondary | ICD-10-CM

## 2012-03-16 DIAGNOSIS — G20 Parkinson's disease: Secondary | ICD-10-CM

## 2012-03-17 ENCOUNTER — Telehealth: Payer: Self-pay | Admitting: Family Medicine

## 2012-03-17 NOTE — Telephone Encounter (Signed)
[facility] called and said that they rcvd Face to Face, but they are going to fax it back to pcp because more info is needed for the home bound questions. Pls fax back asap.

## 2012-03-17 NOTE — Telephone Encounter (Signed)
noted 

## 2012-04-01 ENCOUNTER — Encounter: Payer: Self-pay | Admitting: Family Medicine

## 2012-05-12 ENCOUNTER — Telehealth: Payer: Self-pay | Admitting: Family Medicine

## 2012-05-12 NOTE — Telephone Encounter (Signed)
Refill request for Doxycycline 100 mg take 1 po bid and send to [facility].

## 2012-05-13 MED ORDER — DOXYCYCLINE HYCLATE 100 MG PO TABS: 100.0000 mg | ORAL_TABLET | Freq: Two times a day (BID) | ORAL | Status: DC

## 2012-05-13 NOTE — Telephone Encounter (Signed)
Call in #60 with 5 rf 

## 2012-05-13 NOTE — Telephone Encounter (Signed)
Script was printed and faxed. 

## 2012-05-26 ENCOUNTER — Telehealth: Payer: Self-pay | Admitting: Family Medicine

## 2012-05-26 NOTE — Telephone Encounter (Signed)
Refill request for Zyprexa 5 mg take 1 po at noon and at 5 pm. ( [facility])  It looks like there is 2 other doses in the chart.

## 2012-05-26 NOTE — Telephone Encounter (Signed)
It is difficult to tell from her chart exactly what her dose is. What has she been getting at the facility?

## 2012-05-27 MED ORDER — OLANZAPINE 5 MG PO TABS: 5.0000 mg | ORAL_TABLET | Freq: Two times a day (BID) | ORAL | Status: DC

## 2012-05-27 NOTE — Telephone Encounter (Signed)
I spoke with Lorraine [name] and pt is only on Zyprexa 5 mg take 1 at noon and at 5 pm. The other 2 scripts should be deleted.

## 2012-05-27 NOTE — Telephone Encounter (Signed)
I sent in for 5 mg bid

## 2012-05-30 IMAGING — CR DG SHOULDER 2+V*L*
3 series · 3 of 3 positions shown · non-contrast
Comparison: None

CLINICAL DATA: Left shoulder pain post fall

LEFT SHOULDER - 2+ VIEW

[w shoulder ap internal left]
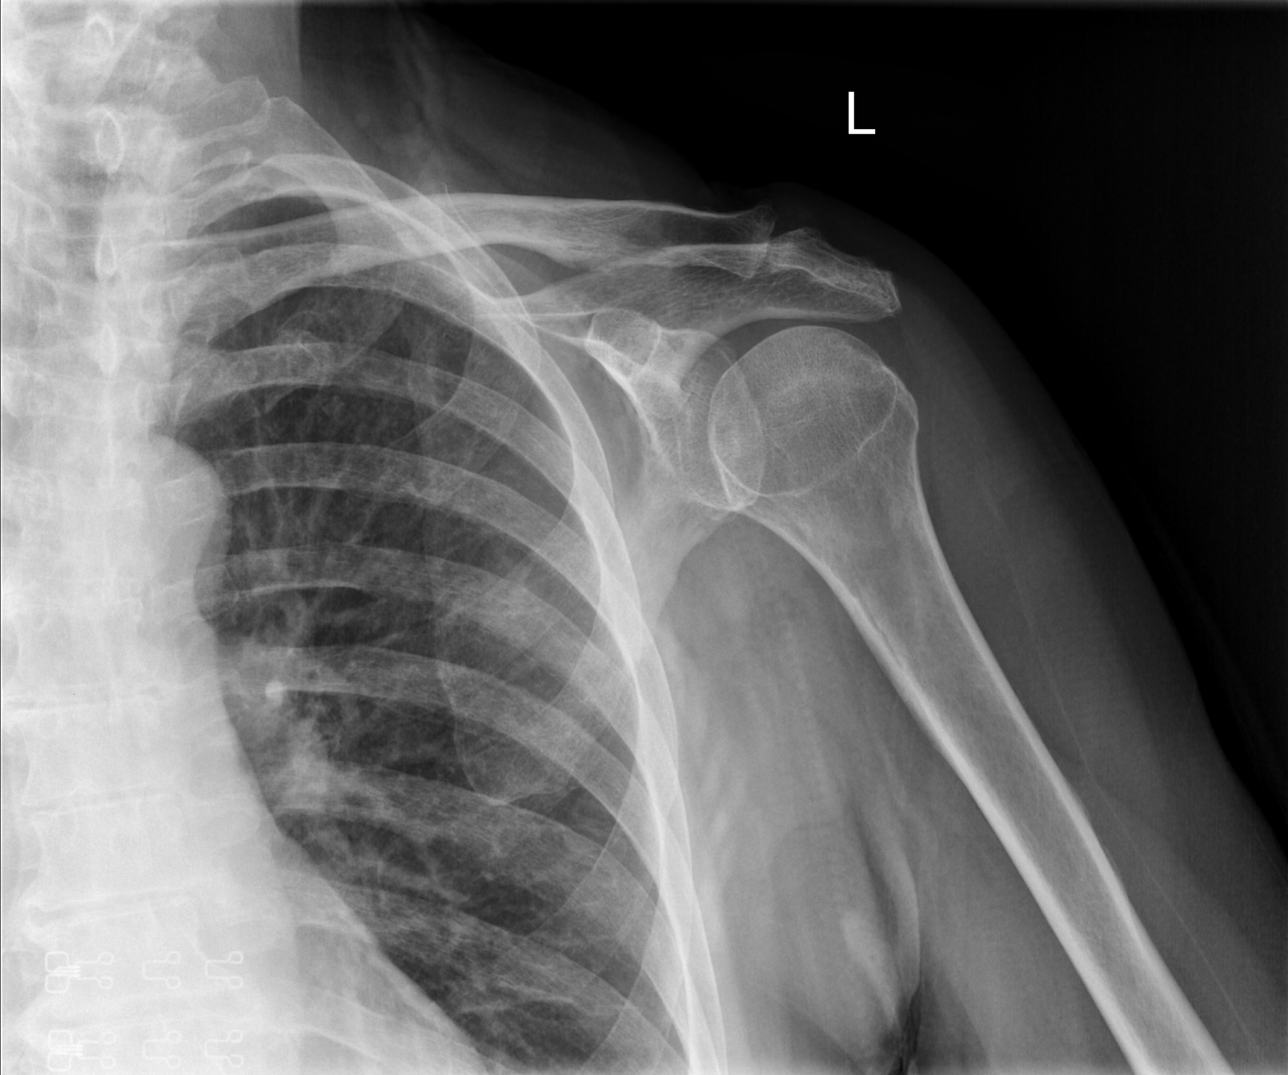

[w shoulder ap external left]
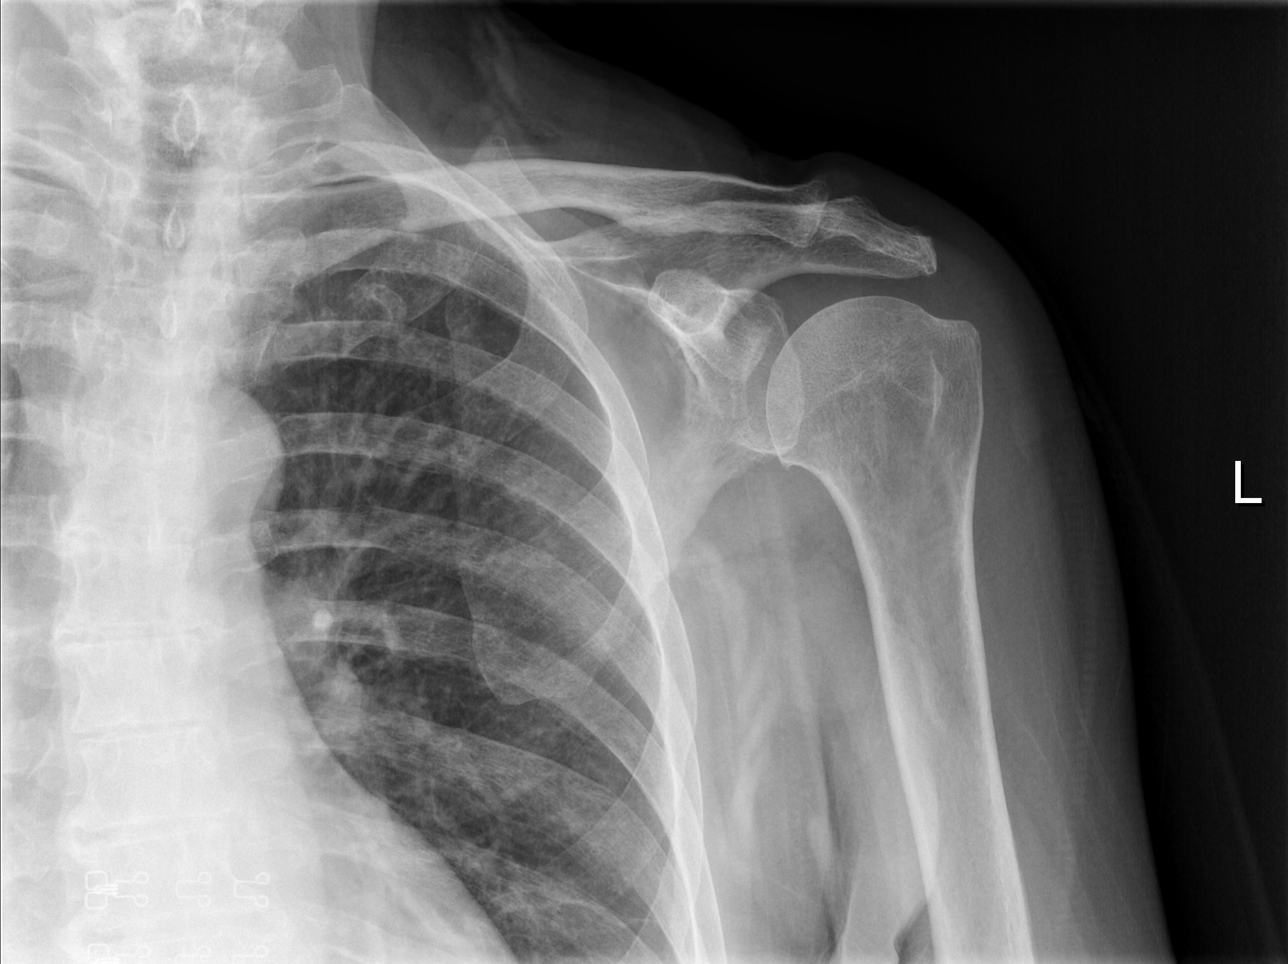

[w shoulder y view left]
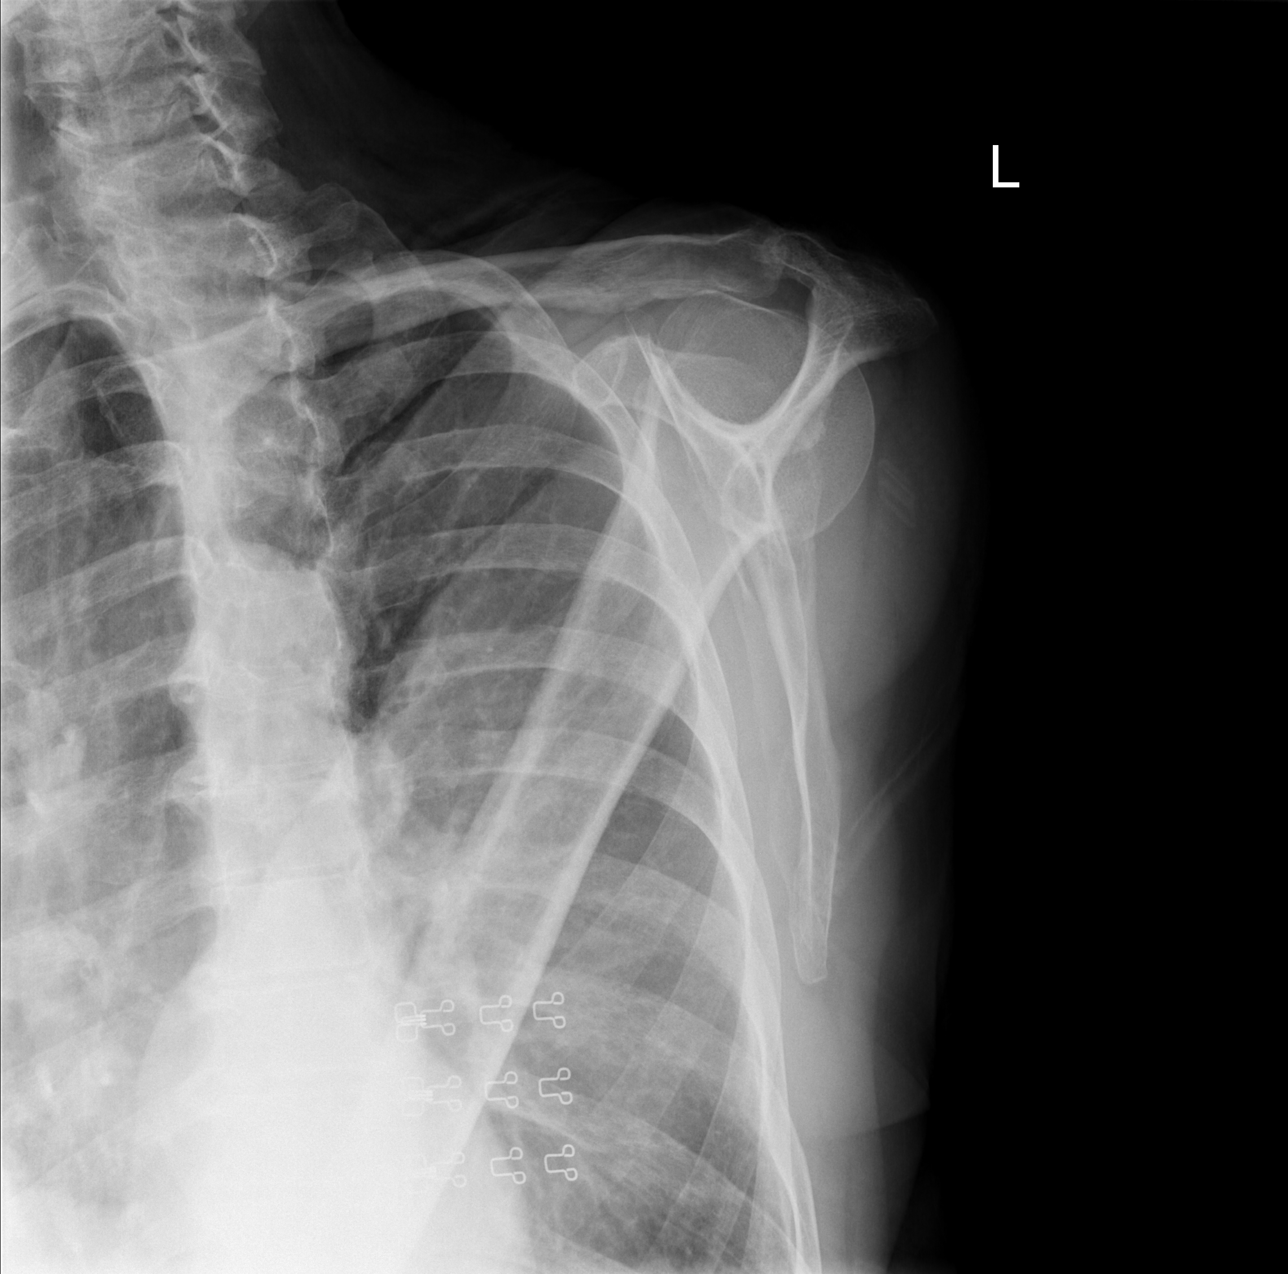

[3 of 3 positions shown; findings below may reference images not displayed]

FINDINGS: Osseous demineralization.
Minimal left AC joint degenerative changes.
No acute fracture, dislocation, or bone destruction.
Visualized left ribs intact.
IMPRESSION: No acute osseous abnormalities.

## 2012-06-10 ENCOUNTER — Telehealth: Payer: Self-pay | Admitting: Family Medicine

## 2012-06-10 MED ORDER — HYDROCORTISONE 1 % EX OINT: TOPICAL_OINTMENT | CUTANEOUS | Status: AC

## 2012-06-10 NOTE — Telephone Encounter (Signed)
Refill request for Hydrocortisone 1 % ointment and I did send e-scribe.

## 2012-06-30 ENCOUNTER — Telehealth: Payer: Self-pay | Admitting: Family Medicine

## 2012-06-30 NOTE — Telephone Encounter (Signed)
Patient's sister came in stating that her sister need a refill of her selenium sulfide (SELSUN) 1 % LOTN Apply 1 application topically 2 (two) times a week. On Wednesday and Friday     Pharmacy: [facility] - [place], [place] - [address] please assist.

## 2012-06-30 NOTE — Telephone Encounter (Signed)
Please refill this for one year  

## 2012-07-02 ENCOUNTER — Telehealth: Payer: Self-pay | Admitting: Family Medicine

## 2012-07-02 MED ORDER — CIPROFLOXACIN HCL 500 MG PO TABS: 500.0000 mg | ORAL_TABLET | Freq: Two times a day (BID) | ORAL | Status: DC

## 2012-07-02 MED ORDER — SELENIUM SULFIDE 1 % EX LOTN: 1.0000 "application " | TOPICAL_LOTION | CUTANEOUS | Status: AC

## 2012-07-02 NOTE — Telephone Encounter (Signed)
I faxed script to the below number.

## 2012-07-02 NOTE — Telephone Encounter (Signed)
I sent script e-scribe. 

## 2012-07-02 NOTE — Telephone Encounter (Signed)
Pt has not been treated for the UTI, fax over script to [facility] at [phone].

## 2012-07-02 NOTE — Telephone Encounter (Signed)
She had a positive UA recently

## 2012-07-06 ENCOUNTER — Encounter: Payer: Self-pay | Admitting: Family Medicine

## 2012-07-06 ENCOUNTER — Ambulatory Visit (INDEPENDENT_AMBULATORY_CARE_PROVIDER_SITE_OTHER): Payer: Medicare PPO | Admitting: Family Medicine

## 2012-07-06 VITALS — BP 168/92 | HR 99 | Temp 97.9°F | Wt 170.0 lb

## 2012-07-06 DIAGNOSIS — F068 Other specified mental disorders due to known physiological condition: Secondary | ICD-10-CM

## 2012-07-06 DIAGNOSIS — G20A1 Parkinson's disease without dyskinesia, without mention of fluctuations: Secondary | ICD-10-CM

## 2012-07-06 DIAGNOSIS — G20 Parkinson's disease: Secondary | ICD-10-CM

## 2012-07-06 MED ORDER — ALPRAZOLAM 0.5 MG PO TABS: ORAL_TABLET | ORAL | Status: DC

## 2012-07-06 MED ORDER — ALPRAZOLAM 2 MG PO TABS: 2.0000 mg | ORAL_TABLET | Freq: Every day | ORAL | Status: DC

## 2012-07-06 MED ORDER — ALPRAZOLAM 1 MG PO TABS: ORAL_TABLET | ORAL | Status: DC

## 2012-07-06 NOTE — Progress Notes (Signed)
  Subjective:    Patient ID: Lorraine [name], female    DOB: [birth year], 67 y.o.   MRN: [id number]  HPI Here with her sister for follow up. She had been doing well in general until last month, and her boils have totally resolved. She is being treated for a UTI this week with Cipro, and her burning has resolved. She had been doing well on Xanax up until 06-02-12 when this was changed by someone at [facility] to Clonazepam. Since then she has been more anxious and agitated, particularly in the evenings. Her sister asks if we can go back to other regimen.    Review of Systems  Constitutional: Negative.   Respiratory: Negative.   Cardiovascular: Negative.   Gastrointestinal: Negative.   Genitourinary: Negative.   Psychiatric/Behavioral: Positive for confusion and agitation. Negative for hallucinations and dysphoric mood. The patient is nervous/anxious.        Objective:   Physical Exam  Constitutional: She appears well-developed and well-nourished.  Cardiovascular: Normal rate, regular rhythm, normal heart sounds and intact distal pulses.   Pulmonary/Chest: Effort normal and breath sounds normal.  Neurological: She is alert.  Psychiatric: She has a normal mood and affect. Her behavior is normal. Thought content normal.          Assessment & Plan:  We will stop the Clonazepam and go back to Xanax as before. Recheck in 3 months

## 2012-07-13 ENCOUNTER — Telehealth: Payer: Self-pay | Admitting: Family Medicine

## 2012-07-13 NOTE — Telephone Encounter (Signed)
RN from [facility] in. States she faxed over paperwork today because there is discrepancy between what pt should be on: Xanax, Ativan, Klonopin (DCd). Please call her back ASAP.

## 2012-07-13 NOTE — Telephone Encounter (Signed)
I spoke to nurse, pt should only be taking the Xanax.

## 2012-08-15 ENCOUNTER — Ambulatory Visit (INDEPENDENT_AMBULATORY_CARE_PROVIDER_SITE_OTHER): Payer: Medicare PPO | Admitting: Internal Medicine

## 2012-08-15 ENCOUNTER — Encounter: Payer: Self-pay | Admitting: Internal Medicine

## 2012-08-15 VITALS — BP 124/84 | Temp 97.6°F | Wt 174.0 lb

## 2012-08-15 DIAGNOSIS — H9209 Otalgia, unspecified ear: Secondary | ICD-10-CM

## 2012-08-15 DIAGNOSIS — L089 Local infection of the skin and subcutaneous tissue, unspecified: Secondary | ICD-10-CM

## 2012-08-15 MED ORDER — SULFAMETHOXAZOLE-TRIMETHOPRIM 800-160 MG PO TABS: 1.0000 | ORAL_TABLET | Freq: Two times a day (BID) | ORAL | Status: DC

## 2012-08-15 MED ORDER — MUPIROCIN 2 % EX OINT: TOPICAL_OINTMENT | Freq: Two times a day (BID) | CUTANEOUS | Status: DC

## 2012-08-15 NOTE — Patient Instructions (Addendum)
Take Bactrim as prescribed for 10 days Use the  cream to the nose for one week. Chlorhexidine : wash daily with the 4% solution (OTC) for seven days; apply it to wet skin from the neck down, especially at skin folds, leave for 1 minute, then rinse  If the breast infection gets worse, you have fever chills ---> see your doctor right away. Please followup with your primary doctor in 10 days. ---- Use peroxide, 4 drops on each ear once a day for the next 10 days

## 2012-08-15 NOTE — Progress Notes (Signed)
  Subjective:    Patient ID: Lorraine [name], female    DOB: August 11, 1945, 68 y.o.   MRN: [id number]  HPI Saturday clinic, acute visit 67 year old female, here with her sister. The patient has multiple medical problems including dementia --lives at a memory unit-- and a history of culture proven MRSA in the past. Yesterday, the patient become aware of 4 " boils" in the right breast. Her sister check on her, there was no discharge, one of the lesions has increased in size since yesterday.  Also 2 days history of L>R ear discomfort.  Past Medical History  Diagnosis Date  . CHF (congestive heart failure)   . NICM (nonischemic cardiomyopathy)     sees Dr. Gala Romney   . Mitral regurgitation     moderate - severe  . HTN (hypertension)   . HLD (hyperlipidemia)   . GERD (gastroesophageal reflux disease)   . Knee pain   . Anemia   . Dementia   . Glaucoma(365)   . Osteoarthritis   . Hypothyroidism   . Colonic polyp   . Parkinson's disease     Sees [name] at [facility]  . Depression     sees [name]  . Asthma     followed by [name]  . Nephrolithiasis   . Glaucoma(365)    Past Surgical History  Procedure Laterality Date  . Cholecystectomy    . Tonsillectomy    . Cardiac catheterization  04/2007    normal coronaries  . Total knee arthroplasty      right. Synvisc injections to the knees     Review of Systems No fever chills. No RN, sore throat or ear discharge.    Objective:   Physical Exam  Constitutional: She appears well-developed. No distress.  HENT:  Head: Normocephalic and atraumatic.  Wax R ear, unable to see the TM. No d/c, swelling. Small amount of wax on the L, TM wnl  Musculoskeletal:       Arms: Skin: She is not diaphoretic.          Assessment & Plan:   Recurrent skin infections,  given history of MRSA, I suspect current skin lesions are  MRSA. Plan: bactrim for 10 days Will try to prevent recurrent infections, see  instructions.  otalgia,  No evidence of infection, she does have a lot of cerumen. See instructions.  Needs to see PCP in 10 days for followup.

## 2012-08-15 NOTE — Progress Notes (Deleted)
Subjective:    Lorraine [name] is a 67 y.o. female being seen today for her obstetrical visit. She is at Unknown gestation. Patient reports {sx:14538}. Fetal movement: {fetal movement:14572}.  Review of Systems:   Review of Systems   Objective:    BP 124/84  Temp(Src) 97.6 F (36.4 C) (Oral)  Wt 174 lb (78.926 kg)  BMI 28.1 kg/m2  Physical Exam  Exam  FHT:  *** BPM  Uterine Size: {fundal height:14540}  Presentation: {fetal pos:14558}     Assessment:    Pregnancy:  No obstetric history on file.    Plan:    Patient Active Problem List  Diagnosis  . TINEA CRURIS  . HYPOTHYROIDISM  . HYPERLIPIDEMIA  . ANEMIA-IRON DEFICIENCY  . DEMENTIA  . DEPRESSION  . PARKINSON'S DISEASE  . GLAUCOMA  . MITRAL REGURGITATION  . HYPERTENSION  . CORONARY ARTERY DISEASE  . CONGESTIVE HEART FAILURE, SYSTOLIC, CHRONIC  . ACUTE SINUSITIS, UNSPECIFIED  . ACUTE BRONCHITIS  . ALLERGIC RHINITIS  . ASTHMA  . GERD  . DUODENITIS  . ALOPECIA  . OSTEOARTHRITIS  . KNEE PAIN  . COCCYGEAL PAIN  . INSOMNIA  . MEMORY LOSS  . OTHER GENERAL SYMPTOMS  . ANOREXIA  . DYSPNEA  . COLONIC POLYPS, HX OF  . Personal History of Urinary Calculi  . Diarrhea    {ob counseling:14517} Follow up in {follow up OB:14565}.

## 2012-08-18 ENCOUNTER — Telehealth: Payer: Self-pay | Admitting: Family Medicine

## 2012-08-18 NOTE — Telephone Encounter (Signed)
Pt has developed two cyst to right breast.  Pt had several last year. Was able to treat then.  One is quarter size and crustated.  Would like to open pt  under home health for monitoring and treatment. Getting therapy currently and need to roll over services for home health while he Gearldine Shown) is there. [facility] of [address] of [place]. Please call today for verbal OK. Dannielle Huh is nurse with [facility]

## 2012-08-18 NOTE — Telephone Encounter (Signed)
Okay for verbal order

## 2012-08-18 NOTE — Telephone Encounter (Signed)
I spoke with Dannielle Huh and gave the verbal order.

## 2012-08-20 ENCOUNTER — Telehealth: Payer: Self-pay | Admitting: Family Medicine

## 2012-08-20 NOTE — Telephone Encounter (Signed)
Lorraine [name] from [facility] called and would like to get verbal orders for speech therapy ( 2 times per week for 5 weeks ) call [phone].

## 2012-08-21 NOTE — Telephone Encounter (Signed)
Okay 

## 2012-08-21 NOTE — Telephone Encounter (Signed)
I spoke with Albin Felling and gave below verbal orders.

## 2012-08-24 ENCOUNTER — Encounter: Payer: Self-pay | Admitting: Family Medicine

## 2012-08-24 ENCOUNTER — Ambulatory Visit (INDEPENDENT_AMBULATORY_CARE_PROVIDER_SITE_OTHER): Payer: Medicare PPO | Admitting: Family Medicine

## 2012-08-24 VITALS — BP 124/70 | HR 97 | Temp 98.0°F | Wt 171.0 lb

## 2012-08-24 DIAGNOSIS — L02239 Carbuncle of trunk, unspecified: Secondary | ICD-10-CM

## 2012-08-24 DIAGNOSIS — N611 Abscess of the breast and nipple: Secondary | ICD-10-CM

## 2012-08-24 MED ORDER — SULFAMETHOXAZOLE-TRIMETHOPRIM 800-160 MG PO TABS: 1.0000 | ORAL_TABLET | Freq: Two times a day (BID) | ORAL | Status: DC

## 2012-08-24 NOTE — Progress Notes (Signed)
  Subjective:    Patient ID: Lorraine [name], female    DOB: 11/15/1945, 67 y.o.   MRN: [id number]  HPI Here with her sister to recheck boils on the right breast. She was seen for this on 07-26-12 and was given a 10 day course of Bactrim. The areas are improving, they are drying up and are less painful. No fever.    Review of Systems  Constitutional: Negative.   Skin: Positive for rash.       Objective:   Physical Exam  Constitutional: She appears well-developed and well-nourished.  Skin:  There are 5 or 6 areas of erythema on the right breast and several of the boils have scabbed over. Mildly tender           Assessment & Plan:  Partially treated boils. Given an additional 14 days of Bactrim DS.

## 2012-08-27 DIAGNOSIS — G20 Parkinson's disease: Secondary | ICD-10-CM

## 2012-08-27 DIAGNOSIS — A4902 Methicillin resistant Staphylococcus aureus infection, unspecified site: Secondary | ICD-10-CM

## 2012-08-27 DIAGNOSIS — G3183 Dementia with Lewy bodies: Secondary | ICD-10-CM

## 2012-08-27 DIAGNOSIS — F0280 Dementia in other diseases classified elsewhere without behavioral disturbance: Secondary | ICD-10-CM

## 2012-08-27 DIAGNOSIS — L02229 Furuncle of trunk, unspecified: Secondary | ICD-10-CM

## 2012-08-27 DIAGNOSIS — Z0279 Encounter for issue of other medical certificate: Secondary | ICD-10-CM

## 2012-09-03 ENCOUNTER — Telehealth: Payer: Self-pay | Admitting: Family Medicine

## 2012-09-03 MED ORDER — ACETAMINOPHEN 325 MG PO TABS: 650.0000 mg | ORAL_TABLET | Freq: Two times a day (BID) | ORAL | Status: AC

## 2012-09-03 NOTE — Telephone Encounter (Signed)
Has she been getting Aleve lately? If so, one tab or two at a time?

## 2012-09-03 NOTE — Telephone Encounter (Signed)
Pt having increased c/o bilat knee pain. PT wants to know if pt can get scheduled pain reliever intermittently throughout day. Please advise and call.

## 2012-09-03 NOTE — Telephone Encounter (Signed)
Pt is taking Aleve but not working, okay to call in Tylenol per Dr. Clent Ridges. I sent order e-scribe and faxed over order to [facility].

## 2012-09-04 ENCOUNTER — Telehealth: Payer: Self-pay | Admitting: Family Medicine

## 2012-09-04 NOTE — Telephone Encounter (Signed)
I left voice message with below information. 

## 2012-09-04 NOTE — Telephone Encounter (Signed)
Speech therapy is working w/patient. States body temp outside normal range. States that she is required to notify us of this. Temp taken at 10:10am and 10:15am. Readings: 94.5 both times (orally) - please advise.

## 2012-09-04 NOTE — Telephone Encounter (Signed)
Nothing to worry about.

## 2012-09-16 ENCOUNTER — Telehealth: Payer: Self-pay | Admitting: Family Medicine

## 2012-09-16 MED ORDER — RIVASTIGMINE 4.6 MG/24HR TD PT24: 1.0000 | MEDICATED_PATCH | Freq: Every day | TRANSDERMAL | Status: DC

## 2012-09-16 NOTE — Telephone Encounter (Signed)
Refill for one year 

## 2012-09-16 NOTE — Telephone Encounter (Signed)
I sent script e-scribe. 

## 2012-09-16 NOTE — Telephone Encounter (Signed)
REFILLS ON  EXELON 4.6 MG / 24 HR PATCH TD 24 # 30 SIG: APPLY 1 PATCH TOPICALLY EVERY DAY ( REMOVE OLD PATCH BEFORE APPLYING NEW PATCH) LAST FILLED 02.24.2014

## 2012-09-16 NOTE — Telephone Encounter (Signed)
Ok to refill or does this need to be sent to Dr. Clent Ridges? Please advise.

## 2012-09-16 NOTE — Telephone Encounter (Signed)
Will send to PCP 

## 2012-09-21 ENCOUNTER — Telehealth: Payer: Self-pay | Admitting: Family Medicine

## 2012-09-21 NOTE — Telephone Encounter (Signed)
I called and gave verbal order and also faxed over the order to [phone]

## 2012-09-21 NOTE — Telephone Encounter (Signed)
The question is do you want pt to take both tylenol & naproxen bid? Please call [phone].

## 2012-09-21 NOTE — Telephone Encounter (Signed)
Keep the Naproxen standing bid, but change Tylenol to bid prn

## 2012-10-03 ENCOUNTER — Emergency Department (HOSPITAL_BASED_OUTPATIENT_CLINIC_OR_DEPARTMENT_OTHER)
Admission: EM | Admit: 2012-10-03 | Discharge: 2012-10-03 | Disposition: A | Discharge status: Home / Self Care | Payer: Medicare PPO | Attending: Emergency Medicine | Admitting: Emergency Medicine

## 2012-10-03 ENCOUNTER — Encounter (HOSPITAL_BASED_OUTPATIENT_CLINIC_OR_DEPARTMENT_OTHER): Payer: Self-pay

## 2012-10-03 DIAGNOSIS — Z7982 Long term (current) use of aspirin: Secondary | ICD-10-CM | POA: Insufficient documentation

## 2012-10-03 DIAGNOSIS — F0390 Unspecified dementia without behavioral disturbance: Secondary | ICD-10-CM | POA: Insufficient documentation

## 2012-10-03 DIAGNOSIS — F329 Major depressive disorder, single episode, unspecified: Secondary | ICD-10-CM | POA: Insufficient documentation

## 2012-10-03 DIAGNOSIS — J45909 Unspecified asthma, uncomplicated: Secondary | ICD-10-CM | POA: Insufficient documentation

## 2012-10-03 DIAGNOSIS — G20 Parkinson's disease: Secondary | ICD-10-CM | POA: Insufficient documentation

## 2012-10-03 DIAGNOSIS — Z8739 Personal history of other diseases of the musculoskeletal system and connective tissue: Secondary | ICD-10-CM | POA: Insufficient documentation

## 2012-10-03 DIAGNOSIS — Z9861 Coronary angioplasty status: Secondary | ICD-10-CM | POA: Insufficient documentation

## 2012-10-03 DIAGNOSIS — F3289 Other specified depressive episodes: Secondary | ICD-10-CM | POA: Insufficient documentation

## 2012-10-03 DIAGNOSIS — I509 Heart failure, unspecified: Secondary | ICD-10-CM | POA: Insufficient documentation

## 2012-10-03 DIAGNOSIS — Z79899 Other long term (current) drug therapy: Secondary | ICD-10-CM | POA: Insufficient documentation

## 2012-10-03 DIAGNOSIS — Z860100 Personal history of colon polyps, unspecified: Secondary | ICD-10-CM | POA: Insufficient documentation

## 2012-10-03 DIAGNOSIS — L089 Local infection of the skin and subcutaneous tissue, unspecified: Secondary | ICD-10-CM | POA: Insufficient documentation

## 2012-10-03 DIAGNOSIS — K219 Gastro-esophageal reflux disease without esophagitis: Secondary | ICD-10-CM | POA: Insufficient documentation

## 2012-10-03 DIAGNOSIS — Z8669 Personal history of other diseases of the nervous system and sense organs: Secondary | ICD-10-CM | POA: Insufficient documentation

## 2012-10-03 DIAGNOSIS — E039 Hypothyroidism, unspecified: Secondary | ICD-10-CM | POA: Insufficient documentation

## 2012-10-03 DIAGNOSIS — I10 Essential (primary) hypertension: Secondary | ICD-10-CM | POA: Insufficient documentation

## 2012-10-03 DIAGNOSIS — Z87442 Personal history of urinary calculi: Secondary | ICD-10-CM | POA: Insufficient documentation

## 2012-10-03 DIAGNOSIS — G20A1 Parkinson's disease without dyskinesia, without mention of fluctuations: Secondary | ICD-10-CM | POA: Insufficient documentation

## 2012-10-03 DIAGNOSIS — Z862 Personal history of diseases of the blood and blood-forming organs and certain disorders involving the immune mechanism: Secondary | ICD-10-CM | POA: Insufficient documentation

## 2012-10-03 DIAGNOSIS — E785 Hyperlipidemia, unspecified: Secondary | ICD-10-CM | POA: Insufficient documentation

## 2012-10-03 DIAGNOSIS — Z8679 Personal history of other diseases of the circulatory system: Secondary | ICD-10-CM | POA: Insufficient documentation

## 2012-10-03 MED ORDER — MUPIROCIN 2 % EX OINT: TOPICAL_OINTMENT | Freq: Two times a day (BID) | CUTANEOUS | Status: AC

## 2012-10-03 MED ORDER — SULFAMETHOXAZOLE-TRIMETHOPRIM 800-160 MG PO TABS: 1.0000 | ORAL_TABLET | Freq: Two times a day (BID) | ORAL | Status: DC

## 2012-10-03 NOTE — ED Provider Notes (Signed)
History     CSN: [id number]  Arrival date & time 10/03/12  1252   First MD Initiated Contact with Patient 10/03/12 1451      Chief Complaint  Patient presents with  . Abscess    (Consider location/radiation/quality/duration/timing/severity/associated sxs/prior treatment) Patient is a 67 y.o. female presenting with abscess. The history is provided by the patient. No language interpreter was used.  Abscess Abscess location: anterior chest. Abscess quality: painful and redness   Red streaking: no   Duration:  2 days Progression:  Worsening Pain details:    Quality:  No pain   Severity:  Moderate   Duration:  2 days   Progression:  Worsening Chronicity:  New Relieved by:  Nothing Ineffective treatments:  None tried Pt complains of pain and abscesses below left breast.    Past Medical History  Diagnosis Date  . CHF (congestive heart failure)   . NICM (nonischemic cardiomyopathy)     sees Dr. Gala Romney   . Mitral regurgitation     moderate - severe  . HTN (hypertension)   . HLD (hyperlipidemia)   . GERD (gastroesophageal reflux disease)   . Knee pain   . Anemia   . Dementia   . Glaucoma(365)   . Osteoarthritis   . Hypothyroidism   . Colonic polyp   . Parkinson's disease     Sees [name] at [facility]  . Depression     sees [name]  . Asthma     followed by [name]  . Nephrolithiasis   . Glaucoma(365)     Past Surgical History  Procedure Laterality Date  . Cholecystectomy    . Tonsillectomy    . Cardiac catheterization  04/2007    normal coronaries  . Total knee arthroplasty      right. Synvisc injections to the knees    Family History  Problem Relation Age of Onset  . Arthritis    . Breast cancer    . Diabetes    . Hyperlipidemia    . Stroke    . Cancer Other     breast    History  Substance Use Topics  . Smoking status: Never Smoker   . Smokeless tobacco: Never Used  . Alcohol Use: No    OB History   Grav Para Term  Preterm Abortions TAB SAB Ect Mult Living                  Review of Systems  Skin: Positive for wound.  All other systems reviewed and are negative.    Allergies  Donepezil hydrochloride; Erythromycin; and Tramadol  Home Medications   Current Outpatient Rx  Name  Route  Sig  Dispense  Refill  . acetaminophen (TYLENOL) 325 MG tablet   Oral   Take 2 tablets (650 mg total) by mouth 2 (two) times daily.   120 tablet   5   . ALPRAZolam (XANAX) 0.5 MG tablet      Take 1 tablet at 8am and take 1 tablet at noon   60 tablet   5   . ALPRAZolam (XANAX) 1 MG tablet      Take 1 tablet at 5pm   30 tablet   5   . alprazolam (XANAX) 2 MG tablet   Oral   Take 1 tablet (2 mg total) by mouth at bedtime.   30 tablet   5   . amantadine (SYMMETREL) 100 MG capsule   Oral   Take 100 mg  by mouth daily.         Marland Kitchen aspirin 81 MG chewable tablet   Oral   Chew 81 mg by mouth daily.         . calcium carbonate (OS-CAL) 600 MG TABS   Oral   Take 1 tablet (600 mg total) by mouth 2 (two) times daily with a meal.   60 tablet   11   . carbamazepine (TEGRETOL XR) 200 MG 12 hr tablet   Oral   Take 200 mg by mouth 3 (three) times daily.         . carbidopa-levodopa (SINEMET) 25-100 MG per tablet   Oral   Take 1 tablet by mouth 3 (three) times daily.           . carvedilol (COREG) 6.25 MG tablet   Oral   Take 1 tablet (6.25 mg total) by mouth 2 (two) times daily with a meal.   60 tablet   11   . doxepin (SINEQUAN) 50 MG capsule   Oral   Take 1 capsule (50 mg total) by mouth at bedtime.   30 capsule   11   . hydrocortisone 1 % ointment   Topical   Apply topically 3 (three) times a week.   30 g   11   . loratadine (CLARITIN) 10 MG tablet   Oral   Take 1 tablet (10 mg total) by mouth daily.   30 tablet   11   . mupirocin ointment (BACTROBAN) 2 %   Topical   Apply topically 2 (two) times daily. To both sides of the nose x 1 week   22 g   0   . naproxen  sodium (ANAPROX) 220 MG tablet   Oral   Take 2 tablets (440 mg total) by mouth 2 (two) times daily with a meal.   120 tablet   6   . OLANZapine (ZYPREXA) 5 MG tablet   Oral   Take 1 tablet (5 mg total) by mouth 2 (two) times daily.   180 tablet   3   . omeprazole (PRILOSEC) 20 MG capsule   Oral   Take 1 capsule (20 mg total) by mouth daily.   30 capsule   11   . PARoxetine (PAXIL) 40 MG tablet   Oral   Take 1 tablet (40 mg total) by mouth every morning.   30 tablet   11   . Prenatal Vit-Fe Fumarate-FA (MULTIVITAMIN-PRENATAL) 27-0.8 MG TABS   Oral   Take 1 tablet by mouth daily.         . rivastigmine (EXELON) 4.6 mg/24hr   Transdermal   Place 1 patch (4.6 mg total) onto the skin daily.   30 patch   11   . selenium sulfide (SELSUN) 1 % LOTN   Topical   Apply 1 application topically 2 (two) times a week. On Wednesday and Friday   240 mL   11   . SODIUM FLUORIDE, DENTAL GEL, (SF) 1.1 % GEL   dental   Place onto teeth at bedtime.           . sulfamethoxazole-trimethoprim (BACTRIM DS,SEPTRA DS) 800-160 MG per tablet   Oral   Take 1 tablet by mouth 2 (two) times daily.   20 tablet   0   . sulfamethoxazole-trimethoprim (BACTRIM DS,SEPTRA DS) 800-160 MG per tablet   Oral   Take 1 tablet by mouth 2 (two) times daily.   28 tablet   0   .  travoprost, benzalkonium, (TRAVATAN) 0.004 % ophthalmic solution   Both Eyes   Place 1 drop into both eyes daily.          . valsartan (DIOVAN) 160 MG tablet   Oral   Take 1 tablet (160 mg total) by mouth 2 (two) times daily.   60 tablet   11     BP 137/68  Pulse 84  Temp(Src) 97.7 F (36.5 C) (Oral)  Resp 18  Ht 5\' 5"  (1.651 m)  Wt 240 lb (108.863 kg)  BMI 39.94 kg/m2  SpO2 98%  Physical Exam  Nursing note and vitals reviewed. Constitutional: She appears well-developed.  HENT:  Head: Normocephalic.  Cardiovascular: Normal rate.   Abdominal: Soft.  Musculoskeletal: Normal range of motion.   Neurological: She is alert.  Skin: Skin is warm. There is erythema.  Psychiatric: She has a normal mood and affect.    ED Course  Procedures (including critical care time)  Labs Reviewed - No data to display No results found.   No diagnosis found.    MDM  Bactrim ds,  Bactroban    See Dr. Clent Ridges for recheck        Elson Areas, PA-C 10/03/12 [phone]

## 2012-10-03 NOTE — ED Provider Notes (Signed)
Medical screening examination/treatment/procedure(s) were performed by non-physician practitioner and as supervising physician I was immediately available for consultation/collaboration.   Gavin Pound. Antoine Fiallos, MD 10/03/12 1511

## 2012-10-03 NOTE — ED Notes (Signed)
Pt states that she has multiple abscess/boils to the L breast and axilla areas.  Pt states that she just noticed them last night.  No drainage or pus noted.  Pt with multiple areas in various stages, many with erythema noted.

## 2012-10-12 ENCOUNTER — Encounter: Payer: Self-pay | Admitting: *Deleted

## 2012-10-16 ENCOUNTER — Encounter: Payer: Self-pay | Admitting: Family Medicine

## 2012-10-16 ENCOUNTER — Ambulatory Visit (INDEPENDENT_AMBULATORY_CARE_PROVIDER_SITE_OTHER): Payer: Medicare PPO | Admitting: Family Medicine

## 2012-10-16 VITALS — BP 122/70 | HR 71 | Temp 97.7°F | Wt 173.0 lb

## 2012-10-16 DIAGNOSIS — L0292 Furuncle, unspecified: Secondary | ICD-10-CM

## 2012-10-16 MED ORDER — CLINDAMYCIN HCL 300 MG PO CAPS: 300.0000 mg | ORAL_CAPSULE | Freq: Four times a day (QID) | ORAL | Status: DC

## 2012-10-16 NOTE — Progress Notes (Signed)
  Subjective:    Patient ID: Lorraine [name], female    DOB: 1946/05/08, 68 y.o.   MRN: [id number]  HPI Here for a recurrence of boils on the abdomen. These started about 2 weeks ago. No fever. She went to [facility] on 10-03-12 and was placed on Bactrim DS and Bactroban ointment. Her sister doesn't think she has improved much since then.    Review of Systems  Constitutional: Negative.   Gastrointestinal: Positive for abdominal pain. Negative for nausea, vomiting, constipation and abdominal distention.  Skin: Positive for wound.       Objective:   Physical Exam  Constitutional: She appears well-developed and well-nourished.  Abdominal: Soft. Bowel sounds are normal. She exhibits no distension and no mass. There is no rebound and no guarding.  2 shallow wounds consistent with boils that have been deroofed. One has purulent drainage. These are tender.           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 A wound culture was obtained. Switch from Bactrim DS to Clindamycin. Recheck in 2 weeks

## 2012-10-19 LAB — WOUND CULTURE

## 2012-10-19 NOTE — Progress Notes (Signed)
Quick Note:  I spoke with pt's sister and gave results. ______

## 2012-11-18 ENCOUNTER — Encounter: Payer: Self-pay | Admitting: *Deleted

## 2012-11-19 ENCOUNTER — Other Ambulatory Visit: Payer: Self-pay | Admitting: Family Medicine

## 2012-11-19 NOTE — Telephone Encounter (Signed)
Can we refill this? 

## 2012-12-05 ENCOUNTER — Other Ambulatory Visit: Payer: Self-pay | Admitting: Family Medicine

## 2013-01-01 ENCOUNTER — Telehealth: Payer: Self-pay | Admitting: Family Medicine

## 2013-01-01 MED ORDER — ALPRAZOLAM 1 MG PO TABS: ORAL_TABLET | ORAL | Status: DC

## 2013-01-01 NOTE — Telephone Encounter (Signed)
pcp not in ofice  Can refill  #30 until [name] back in office.

## 2013-01-01 NOTE — Telephone Encounter (Signed)
I called in script 

## 2013-01-01 NOTE — Telephone Encounter (Signed)
Refill request for Alprazolam 1 mg take 1 po every day at 5 pm and send to [facility] ( fax # [phone] )

## 2013-01-04 ENCOUNTER — Telehealth: Payer: Self-pay | Admitting: Family Medicine

## 2013-01-04 MED ORDER — ALPRAZOLAM 0.5 MG PO TABS: ORAL_TABLET | ORAL | Status: DC

## 2013-01-04 MED ORDER — ALPRAZOLAM 2 MG PO TABS: 2.0000 mg | ORAL_TABLET | Freq: Every day | ORAL | Status: DC

## 2013-01-04 NOTE — Telephone Encounter (Signed)
This was done.

## 2013-01-04 NOTE — Telephone Encounter (Signed)
Refill request for Xanax 0.5 mg take 1 tablet at 8 am and 1 tablet at noon every day. Please see paper fax, must sign and then fax back.

## 2013-01-04 NOTE — Telephone Encounter (Signed)
Can disp  30  [name] to refill further meds

## 2013-01-05 ENCOUNTER — Other Ambulatory Visit: Payer: Self-pay | Admitting: Family Medicine

## 2013-01-05 ENCOUNTER — Other Ambulatory Visit: Payer: Self-pay | Admitting: Internal Medicine

## 2013-01-05 NOTE — Telephone Encounter (Signed)
Ok x 30 # further refills per [name] ( PCP NA today)

## 2013-01-05 NOTE — Telephone Encounter (Signed)
It appears the patient is under the care of Dr. Clent Ridges at this time not Dr. Drue Novel.

## 2013-01-08 NOTE — Telephone Encounter (Signed)
Refill everything for one year except the Sinemet (her neurologist needs to do this)

## 2013-02-04 ENCOUNTER — Telehealth: Payer: Self-pay | Admitting: Family Medicine

## 2013-02-04 NOTE — Telephone Encounter (Signed)
Refill request for Xanas 2 mg take 1 po qhs and send to [facility] phone # [phone] or fax # [phone]. Dr. Clent Ridges will be back in office on Monday 02/08/13 and will handle at that time.

## 2013-02-07 NOTE — Telephone Encounter (Signed)
Refill for 6 months. 

## 2013-02-08 DIAGNOSIS — Z0279 Encounter for issue of other medical certificate: Secondary | ICD-10-CM

## 2013-02-08 NOTE — Telephone Encounter (Signed)
Attempted to call again but unable to leave message.  Will try again

## 2013-02-08 NOTE — Telephone Encounter (Signed)
Tried to call pharmacy unable to leave message.  Will try again later

## 2013-02-15 MED ORDER — ALPRAZOLAM 2 MG PO TABS: 2.0000 mg | ORAL_TABLET | Freq: Every day | ORAL | Status: DC

## 2013-02-15 NOTE — Telephone Encounter (Signed)
I called in the below script to [facility].

## 2013-05-17 ENCOUNTER — Telehealth: Payer: Self-pay | Admitting: Family Medicine

## 2013-05-17 MED ORDER — SULFAMETHOXAZOLE-TMP DS 800-160 MG PO TABS: 1.0000 | ORAL_TABLET | Freq: Two times a day (BID) | ORAL | Status: DC

## 2013-05-17 NOTE — Telephone Encounter (Signed)
Call in Bactrim DS take 1 po bid # 14, pt has UTI.

## 2013-05-17 NOTE — Telephone Encounter (Signed)
I spoke with pt's sister, gave results from urine culture and sent in below script e-scribe to [facility].

## 2013-06-18 ENCOUNTER — Telehealth: Payer: Self-pay | Admitting: Family Medicine

## 2013-06-18 NOTE — Telephone Encounter (Signed)
Faxed to [facility].

## 2013-06-18 NOTE — Telephone Encounter (Signed)
Pt needs an order for a front rolling walker. Her walker broke, screws are missing, pt fell last week w/ this old one. Pt cannot walk w/out  walker. [facility] MUST have an order to get one. (medicare part b) She has a company she can call today to get a new one, if we can fax order over today. FAX:  [phone] Attn : Jacki Cones

## 2013-06-25 ENCOUNTER — Telehealth: Payer: Self-pay | Admitting: Family Medicine

## 2013-06-25 NOTE — Telephone Encounter (Signed)
Order faxed.

## 2013-06-25 NOTE — Telephone Encounter (Signed)
Pt needs order for physical therapy eval for new walker. Pt has the rx for the walker, bur must say needs PT for eval for new walker Order can be faxed to : [facility]  [phone]

## 2013-06-25 NOTE — Telephone Encounter (Signed)
Please advise 

## 2013-06-25 NOTE — Telephone Encounter (Signed)
I wrote such an order and we will fax it over

## 2013-07-05 ENCOUNTER — Telehealth: Payer: Self-pay | Admitting: Family Medicine

## 2013-07-05 NOTE — Telephone Encounter (Signed)
I spoke with Royanne [place], no treatment needed according to results from urine culture. Also I had to fax over the result sheet stating no recollection needed, per Dr. Clent RidgesFry, which I did to # [phone].

## 2013-07-19 ENCOUNTER — Encounter: Payer: Self-pay | Admitting: Family Medicine

## 2013-07-19 ENCOUNTER — Ambulatory Visit (INDEPENDENT_AMBULATORY_CARE_PROVIDER_SITE_OTHER): Payer: Medicare PPO | Admitting: Family Medicine

## 2013-07-19 VITALS — BP 134/72 | HR 81 | Temp 97.8°F | Wt 190.0 lb

## 2013-07-19 DIAGNOSIS — J069 Acute upper respiratory infection, unspecified: Secondary | ICD-10-CM

## 2013-07-19 NOTE — Progress Notes (Signed)
   Subjective:    Patient ID: Lorraine [name], female    DOB: April 26, 1946, 68 y.o.   MRN: [id number]  HPI Patient seen with one-day history of cough and two-day history of mild laryngitis symptoms. She has advanced Parkinson's disease and dementia and history is very limited. She has a caregiver with her. She's had some nasal congestion. No documented fever. No respiratory distress. No nausea or vomiting. She does not have any reported chronic respiratory problems.  Past Medical History  Diagnosis Date  . CHF (congestive heart failure)   . NICM (nonischemic cardiomyopathy)     sees Lorraine. Gala RomneyBensimhon   . Mitral regurgitation     moderate - severe  . HTN (hypertension)   . HLD (hyperlipidemia)   . GERD (gastroesophageal reflux disease)   . Knee pain   . Anemia   . Dementia   . Glaucoma   . Osteoarthritis   . Hypothyroidism   . Colonic polyp   . Parkinson's disease     [name]  . Depression     sees Lorraine Emerson MonteParrish McKinney  . Asthma     followed by Lorraine Delton CoombesByrum  . Nephrolithiasis   . Glaucoma    Past Surgical History  Procedure Laterality Date  . Cholecystectomy    . Tonsillectomy    . Cardiac catheterization  04/2007    normal coronaries  . Total knee arthroplasty      right. Synvisc injections to the knees    reports that she has never smoked. She has never used smokeless tobacco. She reports that she does not drink alcohol or use illicit drugs. family history includes Arthritis in an other family member; Breast cancer in an other family member; Cancer in her other; Diabetes in an other family member; Hyperlipidemia in an other family member; Stroke in an other family member. Allergies  Allergen Reactions  . Donepezil Hydrochloride Other (See Comments)    hostility  . Erythromycin Swelling  . Tramadol Other (See Comments)    nightmares      Review of Systems  Constitutional: Negative for fever and chills.  HENT: Positive for congestion.     Respiratory: Positive for cough.        Objective:   Physical Exam  Constitutional: She appears well-developed.  Patient is alert and cooperative in no distress  HENT:  Right Ear: External ear normal.  Left Ear: External ear normal.  Mouth/Throat: Oropharynx is clear and moist.  Neck: Neck supple.  Cardiovascular: Normal rate.   Pulmonary/Chest: Effort normal and breath sounds normal. No respiratory distress. She has no wheezes. She has no rales.  Lymphadenopathy:    She has no cervical adenopathy.  Neurological: She is alert.  Skin: No rash noted.          Assessment & Plan:  Acute upper respiratory infection. Suspect viral. Nonfocal exam. Supportive measures. Followup promptly for fever or worsening symptoms. They will monitor her temperature over the next week.

## 2013-07-19 NOTE — Progress Notes (Signed)
Pre visit review using our clinic review tool, if applicable. No additional management support is needed unless otherwise documented below in the visit note. 

## 2013-07-19 NOTE — Patient Instructions (Signed)
Viral Infections A viral infection can be caused by different types of viruses.Most viral infections are not serious and resolve on their own. However, some infections may cause severe symptoms and may lead to further complications. SYMPTOMS Viruses can frequently cause:  Minor sore throat.  Aches and pains.  Headaches.  Runny nose.  Different types of rashes.  Watery eyes.  Tiredness.  Cough.  Loss of appetite.  Gastrointestinal infections, resulting in nausea, vomiting, and diarrhea. These symptoms do not respond to antibiotics because the infection is not caused by bacteria. However, you might catch a bacterial infection following the viral infection. This is sometimes called a "superinfection." Symptoms of such a bacterial infection may include:  Worsening sore throat with pus and difficulty swallowing.  Swollen neck glands.  Chills and a high or persistent fever.  Severe headache.  Tenderness over the sinuses.  Persistent overall ill feeling (malaise), muscle aches, and tiredness (fatigue).  Persistent cough.  Yellow, green, or brown mucus production with coughing. HOME CARE INSTRUCTIONS   Only take over-the-counter or prescription medicines for pain, discomfort, diarrhea, or fever as directed by your caregiver.  Drink enough water and fluids to keep your urine clear or pale yellow. Sports drinks can provide valuable electrolytes, sugars, and hydration.  Get plenty of rest and maintain proper nutrition. Soups and broths with crackers or rice are fine. SEEK IMMEDIATE MEDICAL CARE IF:   You have severe headaches, shortness of breath, chest pain, neck pain, or an unusual rash.  You have uncontrolled vomiting, diarrhea, or you are unable to keep down fluids.  You or your child has an oral temperature above 102 F (38.9 C), not controlled by medicine.  Your baby is older than 3 months with a rectal temperature of 102 F (38.9 C) or higher.  Your baby is 883  months old or younger with a rectal temperature of 100.4 F (38 C) or higher. MAKE SURE YOU:   Understand these instructions.  Will watch your condition.  Will get help right away if you are not doing well or get worse. Document Released: 03/20/2005 Document Revised: 09/02/2011 Document Reviewed: 10/15/2010 [facility] Patient Information 2014 [employer] [place].   Please check body temperature daily for the next week and be in touch if fever > 100.4

## 2013-07-27 ENCOUNTER — Encounter: Payer: Self-pay | Admitting: Family Medicine

## 2013-08-23 ENCOUNTER — Other Ambulatory Visit: Payer: Medicare PPO

## 2013-08-24 ENCOUNTER — Telehealth: Payer: Self-pay | Admitting: Family Medicine

## 2013-08-24 NOTE — Telephone Encounter (Signed)
Refill request for Diazepam 5 mg take 1 tablet at 8 AM and 2 PM daily.

## 2013-08-24 NOTE — Telephone Encounter (Signed)
Per Dr. Clent RidgesFry, pt is no longer taking Diazepam, she is currently on Xanax. [facility] # [phone].

## 2013-09-17 DIAGNOSIS — Z0279 Encounter for issue of other medical certificate: Secondary | ICD-10-CM

## 2013-10-12 ENCOUNTER — Telehealth: Payer: Self-pay | Admitting: Family Medicine

## 2013-10-12 MED ORDER — CIPROFLOXACIN HCL 500 MG PO TABS: 500.0000 mg | ORAL_TABLET | Freq: Two times a day (BID) | ORAL | Status: DC

## 2013-10-12 NOTE — Telephone Encounter (Signed)
Call in Cipro 500 mg bid for 7 days  

## 2013-10-12 NOTE — Telephone Encounter (Signed)
[name] from [facility] did a UA for cns and it was positive for ECOLI . Would like to know what antibiotic to start her on. Her behavior was different than normal that is why they did the UA . phone number; [phone] can ask for Lorraine [name]. Pt is allergic to erythromycin base medication, aricept

## 2013-10-12 NOTE — Telephone Encounter (Signed)
Tried calling. The number listed below is a fax number.  Will send to this number.

## 2013-10-19 ENCOUNTER — Telehealth: Payer: Self-pay | Admitting: Family Medicine

## 2013-10-19 NOTE — Telephone Encounter (Signed)
Please get more information about this. If they want to start her on PT, please give the order

## 2013-10-19 NOTE — Telephone Encounter (Signed)
Jennifer from [facility],is requesting to get an order from dr. Clent RidgesFry to switch the pt to home health and physical therapy. Will call back with plan care once she receives order to start services.

## 2013-10-20 NOTE — Telephone Encounter (Signed)
I spoke with Victorino DikeJennifer and gave verbal order for the home health due to pt's decline since the recent UTI. Per Dr. Clent RidgesFry okay to take care of this.

## 2013-10-22 ENCOUNTER — Telehealth: Payer: Self-pay | Admitting: Internal Medicine

## 2013-10-22 NOTE — Telephone Encounter (Signed)
Caller name:Brett Relation to pt: [facility] Call back number:[phone]   Reason for call:  Had referral to home health evaluation.  Has plan of care and needs to speak with RN or Dr. Drue NovelPaz

## 2013-10-22 NOTE — Telephone Encounter (Signed)
Nurse called wrong office.  She will call dr fry's office

## 2013-10-28 DIAGNOSIS — F02818 Dementia in other diseases classified elsewhere, unspecified severity, with other behavioral disturbance: Secondary | ICD-10-CM

## 2013-10-28 DIAGNOSIS — G20 Parkinson's disease: Secondary | ICD-10-CM

## 2013-10-28 DIAGNOSIS — F0280 Dementia in other diseases classified elsewhere without behavioral disturbance: Secondary | ICD-10-CM

## 2013-10-28 DIAGNOSIS — F0281 Dementia in other diseases classified elsewhere with behavioral disturbance: Secondary | ICD-10-CM

## 2013-10-28 DIAGNOSIS — G3183 Dementia with Lewy bodies: Secondary | ICD-10-CM

## 2013-10-28 DIAGNOSIS — I10 Essential (primary) hypertension: Secondary | ICD-10-CM

## 2013-11-01 ENCOUNTER — Ambulatory Visit (INDEPENDENT_AMBULATORY_CARE_PROVIDER_SITE_OTHER): Payer: Medicare PPO | Admitting: Family Medicine

## 2013-11-01 ENCOUNTER — Telehealth: Payer: Self-pay | Admitting: Family Medicine

## 2013-11-01 ENCOUNTER — Encounter: Payer: Self-pay | Admitting: Family Medicine

## 2013-11-01 VITALS — BP 125/71 | HR 79 | Temp 97.7°F | Ht 65.0 in | Wt 168.0 lb

## 2013-11-01 DIAGNOSIS — G20 Parkinson's disease: Secondary | ICD-10-CM

## 2013-11-01 DIAGNOSIS — F068 Other specified mental disorders due to known physiological condition: Secondary | ICD-10-CM

## 2013-11-01 DIAGNOSIS — F329 Major depressive disorder, single episode, unspecified: Secondary | ICD-10-CM

## 2013-11-01 DIAGNOSIS — F3289 Other specified depressive episodes: Secondary | ICD-10-CM

## 2013-11-01 MED ORDER — RIVASTIGMINE 9.5 MG/24HR TD PT24: 9.5000 mg | MEDICATED_PATCH | Freq: Every day | TRANSDERMAL | Status: AC

## 2013-11-01 MED ORDER — DOXEPIN HCL 100 MG PO CAPS: 100.0000 mg | ORAL_CAPSULE | Freq: Every day | ORAL | Status: AC

## 2013-11-01 MED ORDER — PAROXETINE HCL 30 MG PO TABS: 30.0000 mg | ORAL_TABLET | Freq: Every day | ORAL | Status: AC

## 2013-11-01 MED ORDER — OLANZAPINE 20 MG PO TABS: 10.0000 mg | ORAL_TABLET | Freq: Two times a day (BID) | ORAL | Status: AC

## 2013-11-01 NOTE — Telephone Encounter (Signed)
Please call the nurse with this message. I have spoken about this with the patient's daughter numerous times over the past few years, and she says that Xanax clearly works better for the patient. She seems to do well and then when the staff changes her back to Valium, she has problems. Lately it sounds like she has been having paranoid delusions which do not occur on Xanax. We can fax in a prescription for more Xanax if needed

## 2013-11-01 NOTE — Progress Notes (Signed)
   Subjective:    Patient ID: Lorraine [name], female    DOB: 08-28-1945, 68 y.o.   MRN: [id number]  HPI Here with her daughter to discuss her recent mental status changes. She is doing well from a physical standpoint and she is benefiting from getting PT 3 days a week. She seems stronger and more stable on her feet. Uses her walker to get around. We have given her a regimen of Xanax 4 times a day and she has done well with this. However someone at her facility wants to switch her to Valium several times a year, and when this happens she always has an adverse reaction. Since I saw her the doses of her Exelon, her Zyprexa, and her Doxepin have all been increased. Her daughter says that she has been more distressed lately with her delusions, and she thinks the staff at her facility are trying to harm her.    Review of Systems  Constitutional: Negative.   Respiratory: Negative.   Cardiovascular: Negative.   Neurological: Negative.   Psychiatric/Behavioral: Positive for hallucinations, behavioral problems, confusion and agitation. Negative for dysphoric mood. The patient is nervous/anxious.        Objective:   Physical Exam  Constitutional: She appears well-developed and well-nourished.  Cardiovascular: Normal rate, regular rhythm, normal heart sounds and intact distal pulses.   Pulmonary/Chest: Effort normal and breath sounds normal.  Neurological: She is alert.  Psychiatric: She has a normal mood and affect. Her behavior is normal.          Assessment & Plan:  We will tell her facility to stop the Valium and get back on Xanax as before. Her daughter will contact us in one week with a report.

## 2013-11-01 NOTE — Progress Notes (Signed)
Pre visit review using our clinic review tool, if applicable. No additional management support is needed unless otherwise documented below in the visit note. 

## 2013-11-01 NOTE — Telephone Encounter (Signed)
[name] from [facility], is requesting to speak with a nurse regarding pt's office  Visit today, states dr. Clent RidgesFry changed pt's meds to xanax however there is not a script for the meds so they would not be able to get it. Also states the xanax does not work for her and this is why they put her on the valium.  Need to speak with the nurse to see what needs to be done.

## 2013-11-03 MED ORDER — ALPRAZOLAM 0.5 MG PO TABS: ORAL_TABLET | ORAL | Status: AC

## 2013-11-03 MED ORDER — SULFAMETHOXAZOLE-TMP DS 800-160 MG PO TABS: 1.0000 | ORAL_TABLET | Freq: Two times a day (BID) | ORAL | Status: AC

## 2013-11-03 MED ORDER — ALPRAZOLAM 1 MG PO TABS: ORAL_TABLET | ORAL | Status: AC

## 2013-11-03 MED ORDER — ALPRAZOLAM 2 MG PO TABS: 2.0000 mg | ORAL_TABLET | Freq: Every day | ORAL | Status: AC

## 2013-11-03 NOTE — Telephone Encounter (Signed)
I spoke with Dottie from [facility] and they will need a script printed and faxed to [phone], also pt has UTI and needs antibiotic. They said that they sent these results several times yesterday.

## 2013-11-03 NOTE — Telephone Encounter (Signed)
rx written for the Bactrim DS

## 2013-11-03 NOTE — Addendum Note (Signed)
Addended by: Gershon CraneFRY, Zunairah Devers A on: 11/03/2013 01:00 PM   Modules accepted: Orders

## 2013-11-03 NOTE — Telephone Encounter (Signed)
I faxed scripts to below number.

## 2013-11-09 ENCOUNTER — Telehealth: Payer: Self-pay | Admitting: Family Medicine

## 2013-11-09 ENCOUNTER — Encounter: Payer: Self-pay | Admitting: Family Medicine

## 2013-11-09 NOTE — Telephone Encounter (Signed)
Pt has had 2 UTI's.  Pt was resistant to first antibiotic.  Med was changed. Lorraine DikeJennifer would like to know if you want to re-ck pt's urine 5-7 days after completion of antibiotic. They will need an order if so. Can call w/ a verbal if you do want to do this. Pt will finuih med tomorrow.

## 2013-11-10 NOTE — Telephone Encounter (Signed)
I left a voice message with the below information. 

## 2013-11-10 NOTE — Telephone Encounter (Signed)
Yes we should retest  the urine, call in an order please

## 2013-11-22 ENCOUNTER — Telehealth: Payer: Self-pay | Admitting: Family Medicine

## 2013-11-22 MED ORDER — CIPROFLOXACIN HCL 500 MG PO TABS: 500.0000 mg | ORAL_TABLET | Freq: Two times a day (BID) | ORAL | Status: DC

## 2013-11-22 NOTE — Telephone Encounter (Signed)
Due to recent UA results send in Cipro 500 mg take 1 po bid X 7 days # 14 and 0 refills. I faxed a handwritten script to [facility], [phone].

## 2013-11-26 ENCOUNTER — Encounter: Payer: Self-pay | Admitting: Family Medicine

## 2013-12-01 ENCOUNTER — Telehealth: Payer: Self-pay | Admitting: Family Medicine

## 2013-12-01 ENCOUNTER — Encounter: Payer: Self-pay | Admitting: Family Medicine

## 2013-12-01 NOTE — Telephone Encounter (Signed)
Lorraine [name] states she needs a verbal order of Occupational Therapy.  You can leave the message on her voice mail if she does not answer.  Pt is having multiple UTI's and problems with day to day activities.

## 2013-12-01 NOTE — Telephone Encounter (Signed)
Please give this order  

## 2013-12-01 NOTE — Telephone Encounter (Signed)
I left voice message for Genelle Bal with the verbal orders for OT.

## 2013-12-03 ENCOUNTER — Telehealth: Payer: Self-pay | Admitting: Family Medicine

## 2013-12-03 NOTE — Telephone Encounter (Signed)
Marchelle Folksmanda called from [facility] is requesting a verbal order for OT    Phone number ; [phone]

## 2013-12-03 NOTE — Telephone Encounter (Signed)
Per Dr. Clent RidgesFry, okay to give verbal order for 2 times a week for two weeks for OT. I spoke with Marchelle Folksmanda and gave verbal order, also she will fax over a form to fill out.

## 2013-12-20 ENCOUNTER — Telehealth: Payer: Self-pay | Admitting: Family Medicine

## 2013-12-20 NOTE — Telephone Encounter (Signed)
Please these orders in

## 2013-12-20 NOTE — Telephone Encounter (Signed)
Lorraine [name] would like a verbal to continue home health 2 x wk for 6 wks; then  One x wk for 2 wks; then one x wk for one wk.  pls call

## 2013-12-21 NOTE — Telephone Encounter (Signed)
I spoke with Marchelle FolksAmanda and gave verbal orders.

## 2013-12-28 ENCOUNTER — Telehealth: Payer: Self-pay | Admitting: Family Medicine

## 2013-12-28 MED ORDER — SULFAMETHOXAZOLE-TMP DS 800-160 MG PO TABS: 1.0000 | ORAL_TABLET | Freq: Two times a day (BID) | ORAL | Status: DC

## 2013-12-28 NOTE — Telephone Encounter (Signed)
I faxed script along with results to below number.

## 2013-12-28 NOTE — Telephone Encounter (Signed)
Per Dr. Clent RidgesFry order Bactrim DS take 1 po bid X 7 days. Please fax to [facility] [phone]. This is based on lab results.

## 2013-12-29 DIAGNOSIS — Z0279 Encounter for issue of other medical certificate: Secondary | ICD-10-CM

## 2014-01-04 ENCOUNTER — Encounter: Payer: Self-pay | Admitting: Family Medicine

## 2014-02-14 ENCOUNTER — Telehealth: Payer: Self-pay | Admitting: Family Medicine

## 2014-02-14 MED ORDER — CIPROFLOXACIN HCL 500 MG PO TABS: 500.0000 mg | ORAL_TABLET | Freq: Two times a day (BID) | ORAL | Status: DC

## 2014-02-14 NOTE — Telephone Encounter (Signed)
See pt's recent scanned lab results- per Dr. Clent Ridges call in Cipro 500 mg take bid X 7 days and 0 refills. I sent script e-scribe and also a hand written script was faxed to [facility] at [phone], also spoke with sister and gave results.

## 2014-04-01 DIAGNOSIS — K219 Gastro-esophageal reflux disease without esophagitis: Secondary | ICD-10-CM

## 2014-04-01 DIAGNOSIS — G20 Parkinson's disease: Secondary | ICD-10-CM

## 2014-04-01 DIAGNOSIS — R1319 Other dysphagia: Secondary | ICD-10-CM

## 2014-04-01 DIAGNOSIS — R41841 Cognitive communication deficit: Secondary | ICD-10-CM

## 2014-04-01 DIAGNOSIS — G3183 Dementia with Lewy bodies: Secondary | ICD-10-CM

## 2014-04-19 ENCOUNTER — Telehealth: Payer: Self-pay | Admitting: Family Medicine

## 2014-04-19 MED ORDER — CIPROFLOXACIN HCL 500 MG PO TABS: 500.0000 mg | ORAL_TABLET | Freq: Two times a day (BID) | ORAL | Status: AC

## 2014-04-19 MED ORDER — SULFAMETHOXAZOLE-TMP DS 800-160 MG PO TABS: 1.0000 | ORAL_TABLET | Freq: Two times a day (BID) | ORAL | Status: AC

## 2014-04-19 NOTE — Telephone Encounter (Signed)
Per Dr. Clent RidgesFry, change from Cipro to Bactrim DS, due to urine culture results. I spoke with Joslyn at [facility] and gave this information, also sent script [facility]-scribe to [facility]. Joslyn will give message to pt.

## 2014-04-19 NOTE — Telephone Encounter (Signed)
Pt had recent UA and based on results, Per Dr. Clent RidgesFry send in Cipro 500 mg take 1 po bid for 7 days. I spoke with Rolly SalterHaley at [facility] and send script e-scribe to [facility] [place]. Rolly SalterHaley will give results to pt and let her know that a script was sent in.

## 2014-06-08 ENCOUNTER — Encounter: Payer: Self-pay | Admitting: Family Medicine

## 2014-06-13 ENCOUNTER — Encounter: Payer: Self-pay | Admitting: Family Medicine

## 2014-10-04 ENCOUNTER — Telehealth: Payer: Self-pay | Admitting: Family Medicine

## 2014-10-04 NOTE — Telephone Encounter (Signed)
Refill for 6 months. 

## 2014-10-04 NOTE — Telephone Encounter (Signed)
Lorraine [name] called from [facility] to req a rx for DIAZEPAM  [phone]   [facility]

## 2014-10-06 NOTE — Telephone Encounter (Signed)
I spoke with Sheryle Hailiamond at [facility], they will contact the provider that prescribed this medication. I do not have it in pt's chart.

## 2014-10-19 ENCOUNTER — Encounter: Payer: Self-pay | Admitting: Family Medicine

## 2014-11-24 ENCOUNTER — Telehealth: Payer: Self-pay | Admitting: Family Medicine

## 2014-11-24 NOTE — Telephone Encounter (Signed)
Pt declined to have mammogram.

## 2014-12-06 ENCOUNTER — Telehealth: Payer: Self-pay | Admitting: Family Medicine

## 2014-12-06 MED ORDER — CEFUROXIME AXETIL 500 MG PO TABS: 500.0000 mg | ORAL_TABLET | Freq: Two times a day (BID) | ORAL | Status: AC

## 2014-12-06 NOTE — Telephone Encounter (Signed)
I spoke with Chip Boer and they said to just fax over order, which I did.

## 2014-12-06 NOTE — Telephone Encounter (Signed)
Fax from [facility] came in, pt has been coughing and both eyes draining, change in cognition. Please advise? Per Dr. Clent Ridges order Ceftin 500 mg take 1 twice a day for 10 days.

## 2015-05-10 ENCOUNTER — Telehealth: Payer: Self-pay | Admitting: Family Medicine

## 2015-05-10 NOTE — Telephone Encounter (Signed)
Evette/Diamond from [facility] [phone]) called stating that Mrs. Lorraine [name] was ordered to wear a brace 2 years ago and she quit wearing it a few weeks ago. She needs a written order discontinuing the brace faxed to [phone].

## 2015-05-11 NOTE — Telephone Encounter (Signed)
Script was faxed to below number.  

## 2015-05-11 NOTE — Telephone Encounter (Signed)
Ready to be faxed.

## 2015-05-15 ENCOUNTER — Telehealth: Payer: Self-pay | Admitting: Family Medicine

## 2015-05-15 NOTE — Telephone Encounter (Signed)
Needs verbal order to start physical therapy for pt.  States order was faxed over Friday but have not heard back yet.

## 2015-05-15 NOTE — Telephone Encounter (Signed)
Per Dr. Clent RidgesFry okay to give verbal order. I spoke with Angie and gave verbal order, also form was filled out and faxed back this afternoon.

## 2015-05-25 ENCOUNTER — Telehealth: Payer: Self-pay | Admitting: Family Medicine

## 2015-05-25 NOTE — Telephone Encounter (Signed)
Per Dr. Clent RidgesFry okay to give verbal orders. I spoke with Victorino DikeJennifer and gave verbal orders.

## 2015-05-25 NOTE — Telephone Encounter (Signed)
Lorraine [name] from [facility] [phone]) needs a verbal order for 1 week 1, 3 week 2, 2 week 1 and 1 week 1 for therapeutic exercises and gate training.

## 2015-06-06 DIAGNOSIS — G20 Parkinson's disease: Secondary | ICD-10-CM | POA: Diagnosis not present

## 2015-06-06 DIAGNOSIS — I341 Nonrheumatic mitral (valve) prolapse: Secondary | ICD-10-CM | POA: Diagnosis not present

## 2015-06-06 DIAGNOSIS — R2689 Other abnormalities of gait and mobility: Secondary | ICD-10-CM | POA: Diagnosis not present

## 2015-06-06 DIAGNOSIS — F0391 Unspecified dementia with behavioral disturbance: Secondary | ICD-10-CM | POA: Diagnosis not present

## 2015-11-09 ENCOUNTER — Encounter: Payer: Self-pay | Admitting: Podiatry

## 2015-11-09 ENCOUNTER — Ambulatory Visit (INDEPENDENT_AMBULATORY_CARE_PROVIDER_SITE_OTHER): Payer: Medicare PPO | Admitting: Podiatry

## 2015-11-09 VITALS — BP 125/81 | HR 83

## 2015-11-09 DIAGNOSIS — B351 Tinea unguium: Secondary | ICD-10-CM

## 2015-11-09 DIAGNOSIS — M21962 Unspecified acquired deformity of left lower leg: Secondary | ICD-10-CM | POA: Diagnosis not present

## 2015-11-09 DIAGNOSIS — M79605 Pain in left leg: Secondary | ICD-10-CM | POA: Diagnosis not present

## 2015-11-09 NOTE — Patient Instructions (Signed)
Seen for routine foot care. All nails debrided. No open lesions or abnormal lesions found. Return in 3 months or as needed.

## 2015-11-09 NOTE — Progress Notes (Signed)
SUBJECTIVE: 70 y.o. year old female presents accompanied by care taker with painful callus under left foot.  Patient has dementia and Alzheimer's.   REVIEW OF SYSTEMS: Reviewed medical record for systems review.  OBJECTIVE: DERMATOLOGIC EXAMINATION: Nails: Thick dystrophic nails on both great toes. Skin Integrity: Mild plantar callus under 1st MPJ left foot.   VASCULAR EXAMINATION OF LOWER LIMBS: Pedal pulses: All pedal pulses are palpable with normal pulsation.  No edema or erythema noted. Capillary Filling times within 3 seconds in all digits.  Temperature gradient from tibial crest to dorsum of foot is within normal bilateral.  NEUROLOGIC EXAMINATION OF THE LOWER LIMBS: All epicritic and tactile sensations grossly intact.   MUSCULOSKELETAL EXAMINATION: No gross deformities noted.  Plantar flexed first ray left.   ASSESSMENT: Onychomycosis both great toes. Plantar callus under 1st MPJ left.  Metatarsal deformity left 1st.   PLAN: All nails and calluses debrided.  Patient tolerated well.  Return as needed.

## 2016-02-08 ENCOUNTER — Ambulatory Visit: Payer: Medicare PPO | Admitting: Podiatry

## 2016-02-15 ENCOUNTER — Ambulatory Visit: Payer: Medicare PPO | Admitting: Podiatry

## 2016-02-15 ENCOUNTER — Emergency Department (HOSPITAL_BASED_OUTPATIENT_CLINIC_OR_DEPARTMENT_OTHER)
Admission: EM | Admit: 2016-02-15 | Discharge: 2016-02-15 | Disposition: A | Discharge status: Home / Self Care | Payer: Medicare PPO | Attending: Emergency Medicine | Admitting: Emergency Medicine

## 2016-02-15 ENCOUNTER — Encounter (HOSPITAL_BASED_OUTPATIENT_CLINIC_OR_DEPARTMENT_OTHER): Payer: Self-pay | Admitting: *Deleted

## 2016-02-15 DIAGNOSIS — I110 Hypertensive heart disease with heart failure: Secondary | ICD-10-CM | POA: Insufficient documentation

## 2016-02-15 DIAGNOSIS — Z79899 Other long term (current) drug therapy: Secondary | ICD-10-CM | POA: Diagnosis not present

## 2016-02-15 DIAGNOSIS — I509 Heart failure, unspecified: Secondary | ICD-10-CM | POA: Diagnosis not present

## 2016-02-15 DIAGNOSIS — S0990XA Unspecified injury of head, initial encounter: Secondary | ICD-10-CM | POA: Insufficient documentation

## 2016-02-15 DIAGNOSIS — I2510 Atherosclerotic heart disease of native coronary artery without angina pectoris: Secondary | ICD-10-CM | POA: Diagnosis not present

## 2016-02-15 DIAGNOSIS — E039 Hypothyroidism, unspecified: Secondary | ICD-10-CM | POA: Diagnosis not present

## 2016-02-15 DIAGNOSIS — J45909 Unspecified asthma, uncomplicated: Secondary | ICD-10-CM | POA: Insufficient documentation

## 2016-02-15 DIAGNOSIS — Z7982 Long term (current) use of aspirin: Secondary | ICD-10-CM | POA: Diagnosis not present

## 2016-02-15 DIAGNOSIS — Y999 Unspecified external cause status: Secondary | ICD-10-CM | POA: Diagnosis not present

## 2016-02-15 DIAGNOSIS — Y929 Unspecified place or not applicable: Secondary | ICD-10-CM | POA: Insufficient documentation

## 2016-02-15 DIAGNOSIS — W01198A Fall on same level from slipping, tripping and stumbling with subsequent striking against other object, initial encounter: Secondary | ICD-10-CM | POA: Diagnosis not present

## 2016-02-15 DIAGNOSIS — Y939 Activity, unspecified: Secondary | ICD-10-CM | POA: Insufficient documentation

## 2016-02-15 NOTE — ED Triage Notes (Signed)
Arrived via EMS c/o walking with walker and tripped over someone elses foot and fell to carpet floor and hit left side of head to corner of table. Hematoma and abrasion to left side of face. NO LOC.

## 2016-02-15 NOTE — ED Notes (Signed)
MD at bedside. 

## 2016-02-15 NOTE — ED Provider Notes (Signed)
[facility]Y [facility] Provider Note   CSN: [id number] Arrival date & time: 02/15/16  1207     History   Chief Complaint Chief Complaint  Patient presents with  . Fall  . Head Injury    HPI Lorraine [name] is a 70 y.o. female.  The history is provided by the EMS personnel.  Fall  This is a new problem. The current episode started less than 1 hour ago (fell from standing striking head on the way to the floor, low energy mechanism described). The problem has not changed since onset.Nothing aggravates the symptoms. Nothing relieves the symptoms.    Past Medical History:  Diagnosis Date  . Anemia   . Asthma    followed by [name]  . CHF (congestive heart failure) (HCC)   . Colonic polyp   . Dementia   . Depression    sees [name]  . GERD (gastroesophageal reflux disease)   . Glaucoma   . Glaucoma   . HLD (hyperlipidemia)   . HTN (hypertension)   . Hypothyroidism   . Knee pain   . Mitral regurgitation    moderate - severe  . Nephrolithiasis   . NICM (nonischemic cardiomyopathy) (HCC)    sees Dr. Gala Romney   . Osteoarthritis   . Parkinson's disease    Sees [name] at [facility]    Patient Active Problem List   Diagnosis Date Noted  . Diarrhea 11/22/2010  . DYSPNEA 03/27/2010  . TINEA CRURIS 01/01/2010  . COCCYGEAL PAIN 01/01/2010  . ACUTE SINUSITIS, UNSPECIFIED 10/03/2009  . PARKINSON'S DISEASE 09/04/2009  . INSOMNIA 03/10/2009  . DEMENTIA 03/03/2009  . DUODENITIS 03/03/2009  . OTHER GENERAL SYMPTOMS 01/30/2009  . MITRAL REGURGITATION 09/22/2008  . KNEE PAIN 04/15/2008  . ALOPECIA 09/18/2007  . ACUTE BRONCHITIS 08/14/2007  . ANOREXIA 06/05/2007  . HYPOTHYROIDISM 05/12/2007  . HYPERLIPIDEMIA 05/12/2007  . ANEMIA-IRON DEFICIENCY 05/12/2007  . DEPRESSION 05/12/2007  . GLAUCOMA 05/12/2007  . HYPERTENSION 05/12/2007  . CORONARY ARTERY DISEASE 05/12/2007  . CONGESTIVE HEART FAILURE, SYSTOLIC, CHRONIC 05/12/2007  . ALLERGIC  RHINITIS 05/12/2007  . ASTHMA 05/12/2007  . GERD 05/12/2007  . OSTEOARTHRITIS 05/12/2007  . MEMORY LOSS 05/12/2007  . COLONIC POLYPS, HX OF 05/12/2007  . Personal history of urinary calculi 05/12/2007    Past Surgical History:  Procedure Laterality Date  . CARDIAC CATHETERIZATION  04/2007   normal coronaries  . CHOLECYSTECTOMY    . TONSILLECTOMY    . TOTAL KNEE ARTHROPLASTY     right. Synvisc injections to the knees    OB History    No data available       Home Medications    Prior to Admission medications   Medication Sig Start Date End Date Taking? Authorizing Provider  acetaminophen (TYLENOL) 325 MG tablet Take 2 tablets (650 mg total) by mouth 2 (two) times daily. 09/03/12   Nelwyn Salisbury, MD  ALPRAZolam Prudy Feeler) 0.5 MG tablet Take 1 tablet at 8am and take 1 tablet at noon 11/03/13   Nelwyn Salisbury, MD  ALPRAZolam Prudy Feeler) 1 MG tablet Take 1 tablet at 5pm 11/03/13   Nelwyn Salisbury, MD  alprazolam Prudy Feeler) 2 MG tablet Take 1 tablet (2 mg total) by mouth at bedtime. 11/03/13   Nelwyn Salisbury, MD  amantadine (SYMMETREL) 100 MG capsule Take 100 mg by mouth daily.    Historical Provider, MD  aspirin 81 MG chewable tablet Chew 81 mg by mouth daily.    Historical Provider, MD  calcium carbonate (OS-CAL) 600 MG TABS TAKE 1 TAB BY MOUTH TWICE DAILY WITH MEALS 01/05/13   Nelwyn Salisbury, MD  carbamazepine (TEGRETOL XR) 200 MG 12 hr tablet Take 200 mg by mouth 3 (three) times daily.    Historical Provider, MD  carbidopa-levodopa (SINEMET) 25-100 MG per tablet Take 1 tablet by mouth 3 (three) times daily.      Historical Provider, MD  carvedilol (COREG) 6.25 MG tablet Take 1 tablet (6.25 mg total) by mouth 2 (two) times daily with a meal. 02/17/12   Nelwyn Salisbury, MD  cefUROXime (CEFTIN) 500 MG tablet Take 1 tablet (500 mg total) by mouth 2 (two) times daily with a meal. 12/06/14   Nelwyn Salisbury, MD  ciprofloxacin (CIPRO) 500 MG tablet Take 1 tablet (500 mg total) by mouth 2 (two) times daily.  04/19/14   Nelwyn Salisbury, MD  DIOVAN 160 MG tablet TAKE 1 TAB BY MOUTH TWICE DAILY 01/05/13   Nelwyn Salisbury, MD  doxepin (SINEQUAN) 100 MG capsule Take 1 capsule (100 mg total) by mouth at bedtime. 11/01/13   Nelwyn Salisbury, MD  loratadine (CLARITIN) 10 MG tablet Take 1 tablet (10 mg total) by mouth daily. 03/11/12   Nelwyn Salisbury, MD  mupirocin ointment (BACTROBAN) 2 % Apply topically 2 (two) times daily. To both sides of the nose x 1 week 10/03/12   Elson Areas, PA-C  naproxen sodium (ANAPROX) 220 MG tablet Take 2 tablets (440 mg total) by mouth 2 (two) times daily with a meal. 03/11/12   Nelwyn Salisbury, MD  OLANZapine (ZYPREXA) 20 MG tablet Take 0.5 tablets (10 mg total) by mouth 2 (two) times daily. 11/01/13   Nelwyn Salisbury, MD  omeprazole (PRILOSEC) 20 MG capsule TAKE 1 CAP BY MOUTH EVERY DAY DO NOT CRUSH OR CHEW 01/05/13   Nelwyn Salisbury, MD  PARoxetine (PAXIL) 30 MG tablet Take 1 tablet (30 mg total) by mouth daily. 11/01/13   Nelwyn Salisbury, MD  Prenatal Vit-Fe Fumarate-FA (MULTIVITAMIN-PRENATAL) 27-0.8 MG TABS Take 1 tablet by mouth daily.    Historical Provider, MD  rivastigmine (EXELON) 9.5 mg/24hr Place 1 patch (9.5 mg total) onto the skin daily. 11/01/13   Nelwyn Salisbury, MD  selenium sulfide (SELSUN) 1 % LOTN Apply 1 application topically 2 (two) times a week. On Wednesday and Friday 07/02/12   Nelwyn Salisbury, MD  SODIUM FLUORIDE, DENTAL GEL, (SF) 1.1 % GEL Place onto teeth at bedtime.      Historical Provider, MD  sulfamethoxazole-trimethoprim (BACTRIM DS) 800-160 MG per tablet Take 1 tablet by mouth 2 (two) times daily. 11/03/13   Nelwyn Salisbury, MD  sulfamethoxazole-trimethoprim (BACTRIM DS) 800-160 MG per tablet Take 1 tablet by mouth 2 (two) times daily. 04/19/14   Nelwyn Salisbury, MD  travoprost, benzalkonium, (TRAVATAN) 0.004 % ophthalmic solution Place 1 drop into both eyes daily.     Historical Provider, MD    Family History Family History  Problem Relation Age of Onset  . Arthritis    .  Breast cancer    . Diabetes    . Hyperlipidemia    . Stroke    . Cancer Other     breast    Social History Social History  Substance Use Topics  . Smoking status: Never Smoker  . Smokeless tobacco: Never Used  . Alcohol use No     Allergies   Donepezil hydrochloride; Erythromycin; and Tramadol   Review of Systems  Review of Systems  Unable to perform ROS: Dementia  Level 5 exception to history: dementia    Physical Exam Updated Vital Signs BP (!) 110/46 (BP Location: Right Arm)   Pulse 62   Temp 98.2 F (36.8 C) (Oral)   Resp 16   SpO2 97%   Physical Exam  Constitutional: She appears well-developed and well-nourished. No distress.  HENT:  Head: Normocephalic and atraumatic.  Eyes: Conjunctivae are normal.  Neck: Normal range of motion. Neck supple. No tracheal deviation present.  Cardiovascular: Normal rate and regular rhythm.   Pulmonary/Chest: Effort normal. No respiratory distress.  Abdominal: Soft. She exhibits no distension. There is no tenderness.  Neurological: She is alert. She is disoriented (to baseline per family who is at bedside). No cranial nerve deficit.  Skin: Skin is warm and dry.  Psychiatric: She has a normal mood and affect.  Vitals reviewed.    ED Treatments / Results  Labs (all labs ordered are listed, but only abnormal results are displayed) Labs Reviewed - No data to display  EKG  EKG Interpretation None       Radiology No results found.  Procedures Procedures (including critical care time)  Medications Ordered in ED Medications - No data to display   Initial Impression / Assessment and Plan / ED Course  I have reviewed the triage vital signs and the nursing notes.  Pertinent labs & imaging results that were available during my care of the patient were reviewed by me and considered in my medical decision making (see chart for details).  Clinical Course   70 y.o. female presents with low energy mechanism fall just  PTA with no signs of injury. Family agrees she looks well compared to baseline. Ambulated at baseline prior to discharge. Plan to follow up with PCP as needed and return precautions discussed for worsening or new concerning symptoms.   Final Clinical Impressions(s) / ED Diagnoses   Final diagnoses:  Minor head injury, initial encounter    New Prescriptions New Prescriptions   No medications on file     Lyndal Pulleyaniel Ameisha Mcclellan, MD 02/15/16 1457

## 2016-03-24 ENCOUNTER — Other Ambulatory Visit: Payer: Self-pay | Admitting: Family Medicine

## 2016-05-08 ENCOUNTER — Emergency Department (HOSPITAL_BASED_OUTPATIENT_CLINIC_OR_DEPARTMENT_OTHER): Payer: Medicare PPO

## 2016-05-08 ENCOUNTER — Encounter (HOSPITAL_BASED_OUTPATIENT_CLINIC_OR_DEPARTMENT_OTHER): Payer: Self-pay | Admitting: Emergency Medicine

## 2016-05-08 ENCOUNTER — Emergency Department (HOSPITAL_BASED_OUTPATIENT_CLINIC_OR_DEPARTMENT_OTHER)
Admission: EM | Admit: 2016-05-08 | Discharge: 2016-05-08 | Disposition: A | Discharge status: Skilled Nursing Facility | Payer: Medicare PPO | Attending: Physician Assistant | Admitting: Physician Assistant

## 2016-05-08 DIAGNOSIS — G20 Parkinson's disease: Secondary | ICD-10-CM | POA: Insufficient documentation

## 2016-05-08 DIAGNOSIS — W06XXXA Fall from bed, initial encounter: Secondary | ICD-10-CM | POA: Diagnosis not present

## 2016-05-08 DIAGNOSIS — J45909 Unspecified asthma, uncomplicated: Secondary | ICD-10-CM | POA: Insufficient documentation

## 2016-05-08 DIAGNOSIS — Z23 Encounter for immunization: Secondary | ICD-10-CM | POA: Insufficient documentation

## 2016-05-08 DIAGNOSIS — I110 Hypertensive heart disease with heart failure: Secondary | ICD-10-CM | POA: Diagnosis not present

## 2016-05-08 DIAGNOSIS — Z7982 Long term (current) use of aspirin: Secondary | ICD-10-CM | POA: Diagnosis not present

## 2016-05-08 DIAGNOSIS — F0390 Unspecified dementia without behavioral disturbance: Secondary | ICD-10-CM | POA: Diagnosis not present

## 2016-05-08 DIAGNOSIS — Y929 Unspecified place or not applicable: Secondary | ICD-10-CM | POA: Diagnosis not present

## 2016-05-08 DIAGNOSIS — S0990XA Unspecified injury of head, initial encounter: Secondary | ICD-10-CM | POA: Diagnosis present

## 2016-05-08 DIAGNOSIS — I5022 Chronic systolic (congestive) heart failure: Secondary | ICD-10-CM | POA: Insufficient documentation

## 2016-05-08 DIAGNOSIS — Y999 Unspecified external cause status: Secondary | ICD-10-CM | POA: Diagnosis not present

## 2016-05-08 DIAGNOSIS — Y9389 Activity, other specified: Secondary | ICD-10-CM | POA: Diagnosis not present

## 2016-05-08 DIAGNOSIS — S0101XA Laceration without foreign body of scalp, initial encounter: Secondary | ICD-10-CM | POA: Insufficient documentation

## 2016-05-08 DIAGNOSIS — E039 Hypothyroidism, unspecified: Secondary | ICD-10-CM | POA: Diagnosis not present

## 2016-05-08 MED ORDER — TETANUS-DIPHTH-ACELL PERTUSSIS 5-2.5-18.5 LF-MCG/0.5 IM SUSP
0.5000 mL | Freq: Once | INTRAMUSCULAR | Status: AC
  Administered 2016-05-08: 0.5 mL via INTRAMUSCULAR
  Filled 2016-05-08: qty 0.5

## 2016-05-08 NOTE — ED Triage Notes (Signed)
Pt fell from standing onto the edge of the bed.  Went sideways and hit the back of her head on the windowsill.  Pt did not fall to the floor.  Hit head only.

## 2016-05-08 NOTE — ED Notes (Signed)
ptar called for transport 

## 2016-05-08 NOTE — Discharge Instructions (Signed)
Pt will need staples removed in 7 days. CT head and neck showed no acute injruy.Return with any concerns.

## 2016-05-08 NOTE — ED Notes (Addendum)
ED Provider at bedside to staple head lac.  Pt has been moved to nursing [place] for staff to more closely watch her.  She was attempting to get OOB.  Activity apron given.  TV turned on for pt.

## 2016-05-08 NOTE — ED Provider Notes (Signed)
[facility]GENCY DEPT [facility] Provider Note   CSN: [id number] Arrival date & time: 05/08/16  1501     History   Chief Complaint Chief Complaint  Patient presents with  . Fall    HPI Lorraine [name] is a 70 y.o. female.  HPI   Patient is a 70 year old female with past medical history significant for Parkinson's disease, dementia presenting today with fall. Patient's been seen here multiple times for similar falls. Patient fell back striking the back of her head. Level 5 caveat dementia.  Past Medical History:  Diagnosis Date  . Anemia   . Asthma    followed by Dr Delton CoombesByrum  . CHF (congestive heart failure) (HCC)   . Colonic polyp   . Dementia   . Depression    sees Dr Emerson MonteParrish McKinney  . GERD (gastroesophageal reflux disease)   . Glaucoma   . Glaucoma   . HLD (hyperlipidemia)   . HTN (hypertension)   . Hypothyroidism   . Knee pain   . Mitral regurgitation    moderate - severe  . Nephrolithiasis   . NICM (nonischemic cardiomyopathy) (HCC)    sees Dr. Gala RomneyBensimhon   . Osteoarthritis   . Parkinson's disease    [facility]    Patient Active Problem List   Diagnosis Date Noted  . Diarrhea 11/22/2010  . DYSPNEA 03/27/2010  . TINEA CRURIS 01/01/2010  . COCCYGEAL PAIN 01/01/2010  . ACUTE SINUSITIS, UNSPECIFIED 10/03/2009  . PARKINSON'S DISEASE 09/04/2009  . INSOMNIA 03/10/2009  . DEMENTIA 03/03/2009  . DUODENITIS 03/03/2009  . OTHER GENERAL SYMPTOMS 01/30/2009  . MITRAL REGURGITATION 09/22/2008  . KNEE PAIN 04/15/2008  . ALOPECIA 09/18/2007  . ACUTE BRONCHITIS 08/14/2007  . ANOREXIA 06/05/2007  . HYPOTHYROIDISM 05/12/2007  . HYPERLIPIDEMIA 05/12/2007  . ANEMIA-IRON DEFICIENCY 05/12/2007  . DEPRESSION 05/12/2007  . GLAUCOMA 05/12/2007  . HYPERTENSION 05/12/2007  . CORONARY ARTERY DISEASE 05/12/2007  . CONGESTIVE HEART FAILURE, SYSTOLIC, CHRONIC 05/12/2007  . ALLERGIC RHINITIS 05/12/2007  . ASTHMA 05/12/2007  . GERD 05/12/2007  .  OSTEOARTHRITIS 05/12/2007  . MEMORY LOSS 05/12/2007  . COLONIC POLYPS, HX OF 05/12/2007  . Personal history of urinary calculi 05/12/2007    Past Surgical History:  Procedure Laterality Date  . CARDIAC CATHETERIZATION  04/2007   normal coronaries  . CHOLECYSTECTOMY    . TONSILLECTOMY    . TOTAL KNEE ARTHROPLASTY     right. Synvisc injections to the knees    OB History    No data available       Home Medications      Family History Family History  Problem Relation Age of Onset  . Arthritis    . Breast cancer    . Diabetes    . Hyperlipidemia    . Stroke    . Cancer Other     breast    Social History Social History  Substance Use Topics  . Smoking status: Never Smoker  . Smokeless tobacco: Never Used  . Alcohol use No     Allergies   Aricept [donepezil hcl]; Donepezil hydrochloride; Erythromycin; and Tramadol   Review of Systems Review of Systems  Unable to perform ROS: Dementia     Physical Exam Updated Vital Signs BP 137/88 (BP Location: Right Arm)   Pulse 77   Temp 97.9 F (36.6 C) (Oral)   Resp 16   Wt 140 lb (63.5 kg)   SpO2 100%   BMI 23.30 kg/m   Physical Exam  Constitutional: She  is oriented to person, place, and time. She appears well-developed and well-nourished.  Patient grimaces at baseline.  HENT:  Head: Normocephalic.  Thin laceration to the back of head. Old bruise left temporal area.  Eyes: Conjunctivae are normal. Right eye exhibits no discharge.  Neck: Neck supple.  Cardiovascular: Normal rate, regular rhythm and normal heart sounds.   No murmur heard. Pulmonary/Chest: Effort normal and breath sounds normal. She has no wheezes. She has no rales.  Abdominal: Soft. She exhibits no distension. There is no tenderness.  Musculoskeletal: Normal range of motion. She exhibits no edema.  Moving all 4 extremities with limited range but no pain.  Neurological: She is oriented to person, place, and time. No cranial nerve deficit.    Skin: Skin is warm and dry. No rash noted. She is not diaphoretic.  Psychiatric: She has a normal mood and affect. Her behavior is normal.  Nursing note and vitals reviewed.    ED Treatments / Results  Labs (all labs ordered are listed, but only abnormal results are displayed) Labs Reviewed - No data to display  EKG  EKG Interpretation None       Radiology Ct Head Wo Contrast  Result Date: 05/08/2016 CLINICAL DATA:  Head laceration after fall at home. EXAM: CT HEAD WITHOUT CONTRAST CT CERVICAL SPINE WITHOUT CONTRAST TECHNIQUE: Multidetector CT imaging of the head and cervical spine was performed following the standard protocol without intravenous contrast. Multiplanar CT image reconstructions of the cervical spine were also generated. COMPARISON:  CT scan of April 23, 2016. FINDINGS: CT HEAD FINDINGS Brain: Mild diffuse cortical atrophy is noted. No mass effect or midline shift is noted. Ventricular size is within normal limits. There is no evidence of mass lesion, hemorrhage or acute infarction. Vascular: Atherosclerosis of carotid siphons is noted. Skull: Bony calvarium is unremarkable. Sinuses/Orbits: Visualized paranasal sinuses are unremarkable. Other: None. CT CERVICAL SPINE FINDINGS Alignment: Minimal grade 1 retrolisthesis of C4-5 is noted. Skull base and vertebrae: No fracture is noted. Soft tissues and spinal canal: No significant central spinal canal stenosis is noted. 1.4 cm left thyroid nodule is noted. Disc levels: Mild degenerative disc disease is noted at C3-4. Moderate degenerative disc disease is noted C4-5, C5-6, C6-7 and C7-T1 Upper chest: Visualized lung apices appear normal. Other: None. IMPRESSION: Mild diffuse cortical atrophy. No acute intracranial abnormality seen. Multilevel degenerative disc disease. No acute abnormality seen in the cervical spine. Electronically Signed   By: Lupita RaiderJames  Green Jr, M.D.   On: 05/08/2016 16:24   Ct Cervical Spine Wo Contrast  Result  Date: 05/08/2016 CLINICAL DATA:  Head laceration after fall at home. EXAM: CT HEAD WITHOUT CONTRAST CT CERVICAL SPINE WITHOUT CONTRAST TECHNIQUE: Multidetector CT imaging of the head and cervical spine was performed following the standard protocol without intravenous contrast. Multiplanar CT image reconstructions of the cervical spine were also generated. COMPARISON:  CT scan of April 23, 2016. FINDINGS: CT HEAD FINDINGS Brain: Mild diffuse cortical atrophy is noted. No mass effect or midline shift is noted. Ventricular size is within normal limits. There is no evidence of mass lesion, hemorrhage or acute infarction. Vascular: Atherosclerosis of carotid siphons is noted. Skull: Bony calvarium is unremarkable. Sinuses/Orbits: Visualized paranasal sinuses are unremarkable. Other: None. CT CERVICAL SPINE FINDINGS Alignment: Minimal grade 1 retrolisthesis of C4-5 is noted. Skull base and vertebrae: No fracture is noted. Soft tissues and spinal canal: No significant central spinal canal stenosis is noted. 1.4 cm left thyroid nodule is noted. Disc levels: Mild degenerative disc  disease is noted at C3-4. Moderate degenerative disc disease is noted C4-5, C5-6, C6-7 and C7-T1 Upper chest: Visualized lung apices appear normal. Other: None. IMPRESSION: Mild diffuse cortical atrophy. No acute intracranial abnormality seen. Multilevel degenerative disc disease. No acute abnormality seen in the cervical spine. Electronically Signed   By: Lupita Raider, M.D.   On: 05/08/2016 16:24    Procedures Procedures (including critical care time)  Medications Ordered in ED Medications  Tdap (BOOSTRIX) injection 0.5 mL (0.5 mLs Intramuscular Given 05/08/16 1543)     Initial Impression / Assessment and Plan / ED Course  I have reviewed the triage vital signs and the nursing notes.  Pertinent labs & imaging results that were available during my care of the patient were reviewed by me and considered in my medical decision  making (see chart for details).  Clinical Course     Patient 70 year old female presenting with mechanical fall.. Patient struck the back of her head. Patient has small laceration. We'll update tetanus get CT and repair with staples   LACERATION REPAIR Performed by: Arlana Hove Authorized by: Arlana Hove Consent: Verbal consent obtained. Risks and benefits: risks, benefits and alternatives were discussed Consent given by: patient Patient identity confirmed: provided demographic data Prepped and Draped in normal sterile fashion Wound explored  Laceration Location: head   Laceration Length:3 cm  No Foreign Bodies seen or palpated  Irrigation method: wet cleaned with water Amount of cleaning: standard  Skin closure: 3 staples  Patient tolerance: Patient tolerated the procedure well with no immediate complications.   Final Clinical Impressions(s) / ED Diagnoses   Final diagnoses:  Laceration of scalp, initial encounter    New Prescriptions New Prescriptions   No medications on file     Amro Winebarger Randall An, MD 05/08/16 1654

## 2017-03-24 DEATH — deceased
